# Patient Record
Sex: Female | Born: 1970 | Race: White | Hispanic: No | Marital: Single
Health system: Western US, Academic
[De-identification: ages and names within clinical notes are randomized; demographics above are authoritative.]

## PROBLEM LIST (undated history)

## (undated) DIAGNOSIS — I10 Essential (primary) hypertension: Secondary | ICD-10-CM

## (undated) DIAGNOSIS — M549 Dorsalgia, unspecified: Secondary | ICD-10-CM

## (undated) DIAGNOSIS — G8929 Other chronic pain: Secondary | ICD-10-CM

## (undated) HISTORY — PX: TUBAL LIGATION: SHX77

---

## 2007-04-07 ENCOUNTER — Ambulatory Visit (HOSPITAL_COMMUNITY): Admission: RE | Admit: 2007-04-07 | Discharge: 2007-04-07 | Payer: Self-pay | Admitting: Family Medicine

## 2007-04-13 ENCOUNTER — Other Ambulatory Visit: Admission: RE | Admit: 2007-04-13 | Discharge: 2007-04-13 | Payer: Self-pay | Admitting: Unknown Physician Specialty

## 2007-04-13 ENCOUNTER — Encounter (INDEPENDENT_AMBULATORY_CARE_PROVIDER_SITE_OTHER): Payer: Self-pay | Admitting: Specialist

## 2007-04-13 ENCOUNTER — Encounter (INDEPENDENT_AMBULATORY_CARE_PROVIDER_SITE_OTHER): Payer: Self-pay | Admitting: *Deleted

## 2008-02-09 ENCOUNTER — Ambulatory Visit (HOSPITAL_COMMUNITY): Admission: RE | Admit: 2008-02-09 | Discharge: 2008-02-09 | Payer: Self-pay | Admitting: Family Medicine

## 2014-02-23 MED ADMIN — fentaNYL (PF) (SUBLIMAZE) injection: 100 | INTRAVENOUS | @ 22:00:00 | NDC 00409909332

## 2014-02-23 MED ADMIN — rocuronium (ZEMURON) injection: 30 | INTRAVENOUS | @ 22:00:00 | NDC 00409955805

## 2014-02-23 MED ADMIN — glycopyrrolate (ROBINUL) injection: 0.6 | INTRAVENOUS | @ 23:00:00 | NDC 00143968001

## 2014-02-23 MED ADMIN — lidocaine (PF) (XYLOCAINE) 20 mg/mL (2 %) injection: 60 | INTRAVENOUS | @ 22:00:00 | NDC 63323049507

## 2014-02-23 MED ADMIN — lactated ringers infusion: 250 mL/h | INTRAVENOUS | @ 20:00:00 | NDC 00338011704

## 2014-02-23 MED ADMIN — ketorolac (TORADOL) injection: 30 | INTRAVENOUS | NDC 00409379501

## 2014-02-23 MED ADMIN — dexamethasone (DECADRON) injection: 4 | INTRAVENOUS | @ 22:00:00 | NDC 63323016501

## 2014-02-23 MED ADMIN — bupivacaine (PF) (MARCAINE) 5 mg/mL (0.5 %) injection: 7 | INTRAMUSCULAR | @ 23:00:00 | NDC 00409156029

## 2014-02-23 MED ADMIN — neostigmine (PROSTIGMIN) injection: 3 | INTRAVENOUS | @ 23:00:00 | NDC 00641607701

## 2014-02-23 MED ADMIN — propofol (DIPRIVAN) 10 mg/mL infusion: 100 | INTRAVENOUS | @ 22:00:00 | NDC 63323026965

## 2015-06-06 ENCOUNTER — Emergency Department (HOSPITAL_COMMUNITY): Payer: Self-pay

## 2015-06-06 ENCOUNTER — Emergency Department (HOSPITAL_COMMUNITY)
Admission: EM | Admit: 2015-06-06 | Discharge: 2015-06-06 | Disposition: A | Discharge status: Home / Self Care | Payer: Self-pay | Attending: Emergency Medicine | Admitting: Emergency Medicine

## 2015-06-06 ENCOUNTER — Encounter (HOSPITAL_COMMUNITY): Payer: Self-pay | Admitting: Emergency Medicine

## 2015-06-06 DIAGNOSIS — I10 Essential (primary) hypertension: Secondary | ICD-10-CM | POA: Insufficient documentation

## 2015-06-06 DIAGNOSIS — Y9289 Other specified places as the place of occurrence of the external cause: Secondary | ICD-10-CM | POA: Insufficient documentation

## 2015-06-06 DIAGNOSIS — S43121A Dislocation of right acromioclavicular joint, 100%-200% displacement, initial encounter: Secondary | ICD-10-CM | POA: Insufficient documentation

## 2015-06-06 DIAGNOSIS — Y998 Other external cause status: Secondary | ICD-10-CM | POA: Insufficient documentation

## 2015-06-06 DIAGNOSIS — Y9389 Activity, other specified: Secondary | ICD-10-CM | POA: Insufficient documentation

## 2015-06-06 DIAGNOSIS — W182XXA Fall in (into) shower or empty bathtub, initial encounter: Secondary | ICD-10-CM | POA: Insufficient documentation

## 2015-06-06 DIAGNOSIS — S43101A Unspecified dislocation of right acromioclavicular joint, initial encounter: Secondary | ICD-10-CM

## 2015-06-06 HISTORY — DX: Essential (primary) hypertension: I10

## 2015-06-06 MED ORDER — HYDROCODONE-ACETAMINOPHEN 5-325 MG PO TABS
1.0000 | ORAL_TABLET | Freq: Once | ORAL | Status: AC
  Administered 2015-06-06: 1 via ORAL
  Filled 2015-06-06: qty 1

## 2015-06-06 MED ORDER — IBUPROFEN 600 MG PO TABS: 600.0000 mg | ORAL_TABLET | Freq: Four times a day (QID) | ORAL | Status: DC | PRN

## 2015-06-06 MED ORDER — HYDROCODONE-ACETAMINOPHEN 5-325 MG PO TABS: 1.0000 | ORAL_TABLET | ORAL | Status: DC | PRN

## 2015-06-06 NOTE — ED Notes (Signed)
Pt made aware to return if symptoms worsen or if any life threatening symptoms occur.   

## 2015-06-06 NOTE — Discharge Instructions (Signed)
Acromioclavicular Injuries °The AC (acromioclavicular) joint is the joint in the shoulder where the collarbone (clavicle) meets the shoulder blade (scapula). The part of the shoulder blade connected to the collarbone is called the acromion. Common problems with and treatments for the AC joint are detailed below. °ARTHRITIS °Arthritis occurs when the joint has been injured and the smooth padding between the joints (cartilage) is lost. This is the wear and tear seen in most joints of the body if they have been overused. This causes the joint to produce pain and swelling which is worse with activity.  °AC JOINT SEPARATION °AC joint separation means that the ligaments connecting the acromion of the shoulder blade and collarbone have been damaged, and the two bones no longer line up. AC separations can be anywhere from mild to severe, and are "graded" depending upon which ligaments are torn and how badly they are torn. °· Grade I Injury: the least damage is done, and the AC joint still lines up. °· Grade II Injury: damage to the ligaments which reinforce the AC joint. In a Grade II injury, these ligaments are stretched but not entirely torn. When stressed, the AC joint becomes painful and unstable. °· Grade III Injury: AC and secondary ligaments are completely torn, and the collarbone is no longer attached to the shoulder blade. This results in deformity; a prominence of the end of the clavicle. °AC JOINT FRACTURE °AC joint fracture means that there has been a break in the bones of the AC joint, usually the end of the clavicle. °TREATMENT °TREATMENT OF AC ARTHRITIS °· There is currently no way to replace the cartilage damaged by arthritis. The best way to improve the condition is to decrease the activities which aggravate the problem. Application of ice to the joint helps decrease pain and soreness (inflammation). The use of non-steroidal anti-inflammatory medication is helpful. °· If less conservative measures do not  work, then cortisone shots (injections) may be used. These are anti-inflammatories; they decrease the soreness in the joint and swelling. °· If non-surgical measures fail, surgery may be recommended. The procedure is generally removal of a portion of the end of the clavicle. This is the part of the collarbone closest to your acromion which is stabilized with ligaments to the acromion of the shoulder blade. This surgery may be performed using a tube-like instrument with a light (arthroscope) for looking into a joint. It may also be performed as an open surgery through a small incision by the surgeon. Most patients will have good range of motion within 6 weeks and may return to all activity including sports by 8-12 weeks, barring complications. °TREATMENT OF AN AC SEPARATION °· The initial treatment is to decrease pain. This is best accomplished by immobilizing the arm in a sling and placing an ice pack to the shoulder for 20 to 30 minutes every 2 hours as needed. As the pain starts to subside, it is important to begin moving the fingers, wrist, elbow and eventually the shoulder in order to prevent a stiff or "frozen" shoulder. Instruction on when and how much to move the shoulder will be provided by your caregiver. The length of time needed to regain full motion and function depends on the amount or grade of the injury. Recovery from a Grade I AC separation usually takes 10 to 14 days, whereas a Grade III may take 6 to 8 weeks. °· Grade I and II separations usually do not require surgery. Even Grade III injuries usually allow return to full   activity with few restrictions. Treatment is also based on the activity demands of the injured shoulder. For example, a high level quarterback with an injured throwing arm will receive more aggressive treatment than someone with a desk job who rarely uses his/her arm for strenuous activities. In some cases, a painful lump may persist which could require a later surgery. Surgery  can be very successful, but the benefits must be weighed against the potential risks. TREATMENT OF AN AC JOINT FRACTURE Fracture treatment depends on the type of fracture. Sometimes a splint or sling may be all that is required. Other times surgery may be required for repair. This is more frequently the case when the ligaments supporting the clavicle are completely torn. Your caregiver will help you with these decisions and together you can decide what will be the best treatment. HOME CARE INSTRUCTIONS   Apply ice to the injury for 15-20 minutes each hour while awake for 2 days. Put the ice in a plastic bag and place a towel between the bag of ice and skin.  If a sling has been applied, wear it constantly for as long as directed by your caregiver, even at night. The sling or splint can be removed for bathing or showering or as directed. Be sure to keep the shoulder in the same place as when the sling is on. Do not lift the arm.  If a figure-of-eight splint has been applied it should be tightened gently by another person every day. Tighten it enough to keep the shoulders held back. Allow enough room to place the index finger between the body and strap. Loosen the splint immediately if there is numbness or tingling in the hands.  Take over-the-counter or prescription medicines for pain, discomfort or fever as directed by your caregiver.  If you or your child has received a follow up appointment, it is very important to keep that appointment in order to avoid long term complications, chronic pain or disability. SEEK MEDICAL CARE IF:   The pain is not relieved with medications.  There is increased swelling or discoloration that continues to get worse rather than better.  You or your child has been unable to follow up as instructed.  There is progressive numbness and tingling in the arm, forearm or hand. SEEK IMMEDIATE MEDICAL CARE IF:   The arm is numb, cold or pale.  There is increasing pain  in the hand, forearm or fingers. MAKE SURE YOU:   Understand these instructions.  Will watch your condition.  Will get help right away if you are not doing well or get worse. Document Released: 09/24/2005 Document Revised: 03/08/2012 Document Reviewed: 03/19/2009 [facility] Patient Information 2015 [place]. This information is not intended to replace advice given to you by your health care provider. Make sure you discuss any questions you have with your health care provider.  You may take the hydrocodone prescribed for pain relief.  This will make you drowsy - do not drive within 4 hours of taking this medication.  Also use the ibuprofen as prescribed.  Do not take any additional acetaminophen as there is this product in your hydrocodone.  Use ice as much as is comfortable over the next several days.  Call Dr. Romeo AppleHarrison for an office follow-up for further management of this injury.

## 2015-06-06 NOTE — ED Provider Notes (Signed)
CSN: [id number]     Arrival date & time 06/06/15  1330 History   First MD Initiated Contact with Patient 06/06/15 1435     Chief Complaint  Patient presents with  . Shoulder Pain     (Consider location/radiation/quality/duration/timing/severity/associated sxs/prior Treatment) The history is provided by the patient.   Marie Oliver is a 44 y.o. female presenting with right shoulder pain since slipping in the shower and hitting her right lateral shoulder against the toilet 3 days ago.  She has persistent  Pain which is worsened with movement and has not improved with ice, rest and tylenol used at home. She has noticed a deformity to the shoulder joint as well.  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  She is right handed.  She denies weakness or numbness in her arms and denies any head or neck trauma.     Past Medical History  Diagnosis Date  . Hypertension    Past Surgical History  Procedure Laterality Date  . Tubal ligation     History reviewed. No pertinent family history. History  Substance Use Topics  . Smoking status: Never Smoker   . Smokeless tobacco: Not on file  . Alcohol Use: Yes     Comment: occasional   OB History    No data available     Review of Systems  Constitutional: Negative for fever.  Musculoskeletal: Positive for arthralgias. Negative for myalgias and joint swelling.  Neurological: Negative for weakness and numbness.      Allergies  Codeine  Home Medications   Prior to Admission medications   Medication Sig Start Date End Date Taking? Authorizing Provider  acetaminophen (TYLENOL) 500 MG tablet Take 4,000 mg by mouth daily as needed for moderate pain.   Yes Historical Provider, MD  HYDROcodone-acetaminophen (NORCO/VICODIN) 5-325 MG per tablet Take 1 tablet by mouth every 4 (four) hours as needed. 06/06/15   Burgess AmorJulie Talene Glastetter, PA-C  ibuprofen (ADVIL,MOTRIN) 600 MG  tablet Take 1 tablet (600 mg total) by mouth every 6 (six) hours as needed. 06/06/15   Burgess AmorJulie Mikki Ziff, PA-C   BP 159/95 mmHg  Pulse 68  Temp(Src) 98.5 F (36.9 C) (Oral)  Resp 18  Ht 5\' 10"  (1.778 m)  Wt 230 lb (104.327 kg)  BMI 33.00 kg/m2  SpO2 100%  LMP 04/29/2015 Physical Exam  Constitutional: She appears well-developed and well-nourished.  HENT:  Head: Atraumatic.  Neck: Normal range of motion.  Cardiovascular:  Pulses:      Radial pulses are 2+ on the right side, and 2+ on the left side.  Pulses equal bilaterally  Musculoskeletal: She exhibits tenderness. She exhibits no edema.       Right shoulder: She exhibits decreased range of motion, bony tenderness, pain and decreased strength. She exhibits no swelling, no effusion, no crepitus and normal pulse.  ttp along right clavicle with palpable deformity at distal [facility].    Neurological: She is alert. She has normal strength. She displays normal reflexes. No sensory deficit.  Reflex Scores:      Bicep reflexes are 2+ on the right side and 2+ on the left side. Equal grip strength  Skin: Skin is warm and dry.  Psychiatric: She has a normal mood and affect.    ED Course  Procedures (including critical care time) Labs Review Labs Reviewed - No data to display  Imaging Review Dg Shoulder Right  06/06/2015   CLINICAL DATA:  Shoulder pain. Initial encounter. Trauma few days ago.  EXAM: RIGHT SHOULDER - 2+ VIEW  COMPARISON:  Chest radiograph 02/23/2015.  FINDINGS: There is a new RIGHT AC separation. Superior displacement of the distal clavicle is almost 200%. There is posterior displacement of the clavicle on the axillary view relative to the acromion. This is most compatible with a Type IV AC separation.  The glenohumeral joint is located.  No fracture is present.  IMPRESSION: Type IV RIGHT AC joint separation.   Electronically Signed   By: Andreas Newport M.D.   On: 06/06/2015 15:15     EKG Interpretation None       MDM   Final diagnoses:  Acromioclavicular joint separation, right, initial encounter    Patients labs and/or radiological studies were reviewed and considered during the medical decision making and disposition process.  Results were also discussed with patient. Discussed with Dr. Romeo Apple who will see pt as outpatient - pt to call for appt. Sling provided, ibuprofen, hydrocodone.  Ice tx.  The patient appears reasonably screened and/or stabilized for discharge and I doubt any other medical condition or other [facility] requiring further screening, evaluation, or treatment in the ED at this time prior to discharge.     Burgess Amor, PA-C 06/06/15 1715  Gilda Crease, MD 06/07/15 [phone]

## 2015-06-06 NOTE — ED Notes (Signed)
Pt reports slipped and fell out of the shower on Sunday. Pt report ever since right arm pain. Pt denies hitting head or loc.

## 2015-08-09 ENCOUNTER — Other Ambulatory Visit (HOSPITAL_COMMUNITY): Payer: Self-pay | Admitting: *Deleted

## 2015-08-09 DIAGNOSIS — Z09 Encounter for follow-up examination after completed treatment for conditions other than malignant neoplasm: Secondary | ICD-10-CM

## 2015-08-09 DIAGNOSIS — R921 Mammographic calcification found on diagnostic imaging of breast: Secondary | ICD-10-CM

## 2015-08-20 ENCOUNTER — Encounter (HOSPITAL_COMMUNITY): Payer: Self-pay | Admitting: Anesthesiology

## 2015-08-20 ENCOUNTER — Ambulatory Visit (HOSPITAL_COMMUNITY): Payer: Self-pay | Admitting: Anesthesiology

## 2015-08-20 ENCOUNTER — Encounter (HOSPITAL_COMMUNITY)
Admission: RE | Disposition: A | Discharge status: Home / Self Care | Payer: Self-pay | Source: Ambulatory Visit | Attending: Orthopedic Surgery

## 2015-08-20 ENCOUNTER — Ambulatory Visit (HOSPITAL_COMMUNITY)
Admission: RE | Admit: 2015-08-20 | Discharge: 2015-08-20 | Disposition: A | Discharge status: Home / Self Care | Payer: Self-pay | Source: Ambulatory Visit | Attending: Orthopedic Surgery | Admitting: Orthopedic Surgery

## 2015-08-20 ENCOUNTER — Other Ambulatory Visit: Payer: Self-pay | Admitting: Orthopedic Surgery

## 2015-08-20 DIAGNOSIS — I10 Essential (primary) hypertension: Secondary | ICD-10-CM | POA: Insufficient documentation

## 2015-08-20 DIAGNOSIS — S82841A Displaced bimalleolar fracture of right lower leg, initial encounter for closed fracture: Secondary | ICD-10-CM

## 2015-08-20 DIAGNOSIS — W19XXXA Unspecified fall, initial encounter: Secondary | ICD-10-CM | POA: Insufficient documentation

## 2015-08-20 DIAGNOSIS — Z885 Allergy status to narcotic agent status: Secondary | ICD-10-CM | POA: Insufficient documentation

## 2015-08-20 HISTORY — PX: ORIF ANKLE FRACTURE: SHX5408

## 2015-08-20 LAB — BASIC METABOLIC PANEL
Anion gap: 11 (ref 5–15)
BUN: 6 mg/dL (ref 6–20)
CALCIUM: 9.3 mg/dL (ref 8.9–10.3)
CO2: 24 mmol/L (ref 22–32)
CREATININE: 0.75 mg/dL (ref 0.44–1.00)
Chloride: 105 mmol/L (ref 101–111)
GFR calc non Af Amer: >60 mL/min (ref >60)
Glucose, Bld: 93 mg/dL (ref 65–99)
Potassium: 3.9 mmol/L (ref 3.5–5.1)
SODIUM: 140 mmol/L (ref 135–145)

## 2015-08-20 LAB — CBC
HCT: 34.3 % — ABNORMAL LOW (ref 36.0–46.0)
Hemoglobin: 10.5 g/dL — ABNORMAL LOW (ref 12.0–15.0)
MCH: 24.4 pg — ABNORMAL LOW (ref 26.0–34.0)
MCHC: 30.6 g/dL (ref 30.0–36.0)
MCV: 79.6 fL (ref 78.0–100.0)
PLATELETS: 357 10*3/uL (ref 150–400)
RBC: 4.31 MIL/uL (ref 3.87–5.11)
RDW: 16.2 % — AB (ref 11.5–15.5)
WBC: 10.9 10*3/uL — AB (ref 4.0–10.5)

## 2015-08-20 LAB — HCG, SERUM, QUALITATIVE: Preg, Serum: NEGATIVE

## 2015-08-20 LAB — SURGICAL PCR SCREEN
MRSA, PCR: NEGATIVE
Staphylococcus aureus: NEGATIVE

## 2015-08-20 SURGERY — OPEN REDUCTION INTERNAL FIXATION (ORIF) ANKLE FRACTURE
Anesthesia: General | Site: Ankle | Laterality: Bilateral

## 2015-08-20 MED ORDER — ONDANSETRON HCL 4 MG/2ML IJ SOLN
INTRAMUSCULAR | Status: AC
  Filled 2015-08-20: qty 2

## 2015-08-20 MED ORDER — MIDAZOLAM HCL 2 MG/2ML IJ SOLN
INTRAMUSCULAR | Status: AC
  Filled 2015-08-20: qty 2

## 2015-08-20 MED ORDER — LIDOCAINE HCL (CARDIAC) 20 MG/ML IV SOLN
INTRAVENOUS | Status: AC
  Filled 2015-08-20: qty 5

## 2015-08-20 MED ORDER — MIDAZOLAM HCL 2 MG/2ML IJ SOLN
INTRAMUSCULAR | Status: AC
  Filled 2015-08-20: qty 4

## 2015-08-20 MED ORDER — OXYCODONE-ACETAMINOPHEN 5-325 MG PO TABS: 1.0000 | ORAL_TABLET | Freq: Four times a day (QID) | ORAL | Status: DC | PRN

## 2015-08-20 MED ORDER — MUPIROCIN 2 % EX OINT
1.0000 "application " | TOPICAL_OINTMENT | Freq: Once | CUTANEOUS | Status: AC
  Administered 2015-08-20: 1 via TOPICAL
  Filled 2015-08-20: qty 22

## 2015-08-20 MED ORDER — LACTATED RINGERS IV SOLN
INTRAVENOUS | Status: DC
  Administered 2015-08-20 (×2): via INTRAVENOUS

## 2015-08-20 MED ORDER — MIDAZOLAM HCL 5 MG/5ML IJ SOLN
INTRAMUSCULAR | Status: DC | PRN
  Administered 2015-08-20: 2 mg via INTRAVENOUS

## 2015-08-20 MED ORDER — PROPOFOL 10 MG/ML IV BOLUS
INTRAVENOUS | Status: AC
  Filled 2015-08-20: qty 20

## 2015-08-20 MED ORDER — PHENYLEPHRINE HCL 10 MG/ML IJ SOLN
INTRAMUSCULAR | Status: DC | PRN
  Administered 2015-08-20: 40 ug via INTRAVENOUS

## 2015-08-20 MED ORDER — PHENYLEPHRINE 40 MCG/ML (10ML) SYRINGE FOR IV PUSH (FOR BLOOD PRESSURE SUPPORT)
PREFILLED_SYRINGE | INTRAVENOUS | Status: AC
  Filled 2015-08-20: qty 10

## 2015-08-20 MED ORDER — 0.9 % SODIUM CHLORIDE (POUR BTL) OPTIME
TOPICAL | Status: DC | PRN
  Administered 2015-08-20: 1000 mL

## 2015-08-20 MED ORDER — DEXAMETHASONE SODIUM PHOSPHATE 4 MG/ML IJ SOLN
INTRAMUSCULAR | Status: AC
  Filled 2015-08-20: qty 2

## 2015-08-20 MED ORDER — FENTANYL CITRATE (PF) 100 MCG/2ML IJ SOLN
INTRAMUSCULAR | Status: DC | PRN
  Administered 2015-08-20 (×4): 50 ug via INTRAVENOUS

## 2015-08-20 MED ORDER — ONDANSETRON HCL 4 MG/2ML IJ SOLN
INTRAMUSCULAR | Status: DC | PRN
  Administered 2015-08-20: 4 mg via INTRAVENOUS

## 2015-08-20 MED ORDER — DEXAMETHASONE SODIUM PHOSPHATE 4 MG/ML IJ SOLN
INTRAMUSCULAR | Status: DC | PRN
  Administered 2015-08-20: 8 mg via INTRAVENOUS

## 2015-08-20 MED ORDER — PROPOFOL 10 MG/ML IV BOLUS
INTRAVENOUS | Status: DC | PRN
  Administered 2015-08-20: 200 mg via INTRAVENOUS

## 2015-08-20 MED ORDER — FENTANYL CITRATE (PF) 100 MCG/2ML IJ SOLN
INTRAMUSCULAR | Status: AC
  Filled 2015-08-20: qty 2

## 2015-08-20 MED ORDER — BUPIVACAINE HCL (PF) 0.25 % IJ SOLN
INTRAMUSCULAR | Status: AC
  Filled 2015-08-20: qty 30

## 2015-08-20 MED ORDER — CEFAZOLIN SODIUM-DEXTROSE 2-3 GM-% IV SOLR
2.0000 g | INTRAVENOUS | Status: AC
  Administered 2015-08-20: 2 g via INTRAVENOUS
  Filled 2015-08-20: qty 50

## 2015-08-20 MED ORDER — LIDOCAINE HCL (CARDIAC) 20 MG/ML IV SOLN
INTRAVENOUS | Status: DC | PRN
  Administered 2015-08-20: 50 mg via INTRAVENOUS

## 2015-08-20 MED ORDER — FENTANYL CITRATE (PF) 250 MCG/5ML IJ SOLN
INTRAMUSCULAR | Status: AC
  Filled 2015-08-20: qty 5

## 2015-08-20 MED ORDER — CHLORHEXIDINE GLUCONATE 4 % EX LIQD: 60.0000 mL | Freq: Once | CUTANEOUS | Status: DC

## 2015-08-20 SURGICAL SUPPLY — 61 items
BANDAGE ELASTIC 4 VELCRO ST LF (GAUZE/BANDAGES/DRESSINGS) ×2 IMPLANT
BANDAGE ELASTIC 6 VELCRO ST LF (GAUZE/BANDAGES/DRESSINGS) ×2 IMPLANT
BANDAGE ESMARK 6X9 LF (GAUZE/BANDAGES/DRESSINGS) ×1 IMPLANT
BIT DRILL 2.5X2.75 QC CALB (BIT) ×2 IMPLANT
BIT DRILL 3.5X5.5 QC CALB (BIT) ×2 IMPLANT
BLADE SURG 10 STRL SS (BLADE) ×1 IMPLANT
BLADE SURG ROTATE 9660 (MISCELLANEOUS) IMPLANT
BNDG CMPR 9X6 STRL LF SNTH (GAUZE/BANDAGES/DRESSINGS) ×1
BNDG ESMARK 6X9 LF (GAUZE/BANDAGES/DRESSINGS) ×3
COVER MAYO STAND STRL (DRAPES) ×1 IMPLANT
COVER SURGICAL LIGHT HANDLE (MISCELLANEOUS) ×3 IMPLANT
CUFF TOURNIQUET SINGLE 34IN LL (TOURNIQUET CUFF) ×2 IMPLANT
CUFF TOURNIQUET SINGLE 44IN (TOURNIQUET CUFF) IMPLANT
DRAPE OEC MINIVIEW 54X84 (DRAPES) ×2 IMPLANT
DRAPE U-SHAPE 47X51 STRL (DRAPES) ×3 IMPLANT
DRSG PAD ABDOMINAL 8X10 ST (GAUZE/BANDAGES/DRESSINGS) ×2 IMPLANT
DURAPREP 26ML APPLICATOR (WOUND CARE) ×3 IMPLANT
ELECT REM PT RETURN 9FT ADLT (ELECTROSURGICAL) ×3
ELECTRODE REM PT RTRN 9FT ADLT (ELECTROSURGICAL) ×1 IMPLANT
GAUZE SPONGE 4X4 12PLY STRL (GAUZE/BANDAGES/DRESSINGS) ×2 IMPLANT
GAUZE XEROFORM 1X8 LF (GAUZE/BANDAGES/DRESSINGS) ×4 IMPLANT
GLOVE BIOGEL PI IND STRL 8 (GLOVE) ×2 IMPLANT
GLOVE BIOGEL PI INDICATOR 8 (GLOVE) ×4
GLOVE ECLIPSE 7.5 STRL STRAW (GLOVE) ×6 IMPLANT
GOWN STRL REUS W/ TWL LRG LVL3 (GOWN DISPOSABLE) ×1 IMPLANT
GOWN STRL REUS W/ TWL XL LVL3 (GOWN DISPOSABLE) ×2 IMPLANT
GOWN STRL REUS W/TWL LRG LVL3 (GOWN DISPOSABLE) ×3
GOWN STRL REUS W/TWL XL LVL3 (GOWN DISPOSABLE) ×6
KIT BASIN OR (CUSTOM PROCEDURE TRAY) ×3 IMPLANT
KIT ROOM TURNOVER OR (KITS) ×3 IMPLANT
MANIFOLD NEPTUNE II (INSTRUMENTS) ×1 IMPLANT
NS IRRIG 1000ML POUR BTL (IV SOLUTION) ×3 IMPLANT
PACK ORTHO EXTREMITY (CUSTOM PROCEDURE TRAY) ×3 IMPLANT
PAD ARMBOARD 7.5X6 YLW CONV (MISCELLANEOUS) ×6 IMPLANT
PAD CAST 4YDX4 CTTN HI CHSV (CAST SUPPLIES) IMPLANT
PADDING CAST COTTON 4X4 STRL (CAST SUPPLIES) ×6
PADDING CAST COTTON 6X4 STRL (CAST SUPPLIES) ×2 IMPLANT
PLATE 6H 3.5 143556 (Plate) ×2 IMPLANT
SCREW ACE CAN 4.0 16M (Screw) ×4 IMPLANT
SCREW CORT 3.5X16 815037016 (Screw) ×2 IMPLANT
SCREW CORTICAL 3.5MM  20MM (Screw) ×2 IMPLANT
SCREW CORTICAL 3.5MM 14MM (Screw) ×2 IMPLANT
SCREW CORTICAL 3.5MM 20MM (Screw) IMPLANT
SCREW NLOCK CANC HEX 4X14 (Screw) ×2 IMPLANT
SCREW NLOCK CANC HEX 4X16 (Screw) ×2 IMPLANT
SPLINT FIBERGLASS 4X30 (CAST SUPPLIES) ×2 IMPLANT
SPONGE LAP 4X18 X RAY DECT (DISPOSABLE) ×4 IMPLANT
STAPLER VISISTAT 35W (STAPLE) IMPLANT
SUCTION FRAZIER TIP 10 FR DISP (SUCTIONS) ×3 IMPLANT
SUT ETHILON 3 0 PS 1 (SUTURE) ×2 IMPLANT
SUT ETHILON 4 0 PS 2 18 (SUTURE) IMPLANT
SUT VIC AB 0 CTB1 27 (SUTURE) ×2 IMPLANT
SUT VIC AB 2-0 FS1 27 (SUTURE) ×2 IMPLANT
SUT VIC AB 3-0 FS2 27 (SUTURE) ×2 IMPLANT
SYR CONTROL 10ML LL (SYRINGE) IMPLANT
TOWEL OR 17X24 6PK STRL BLUE (TOWEL DISPOSABLE) ×3 IMPLANT
TOWEL OR 17X26 10 PK STRL BLUE (TOWEL DISPOSABLE) ×3 IMPLANT
TUBE CONNECTING 12'X1/4 (SUCTIONS) ×1
TUBE CONNECTING 12X1/4 (SUCTIONS) ×2 IMPLANT
WATER STERILE IRR 1000ML POUR (IV SOLUTION) ×1 IMPLANT
WIRE K 1.6MM 144256 (MISCELLANEOUS) ×4 IMPLANT

## 2015-08-20 NOTE — Anesthesia Procedure Notes (Addendum)
Procedure Name: LMA Insertion Date/Time: 08/20/2015 3:21 PM Performed by: Marylyn Ishihara Pre-anesthesia Checklist: Patient identified, Emergency Drugs available, Suction available, Patient being monitored and Timeout performed Patient Re-evaluated:Patient Re-evaluated prior to inductionOxygen Delivery Method: Circle system utilized Preoxygenation: Pre-oxygenation with 100% oxygen Intubation Type: IV induction Ventilation: Mask ventilation without difficulty LMA: LMA inserted LMA Size: 4.0 Number of attempts: 1 Placement Confirmation: positive ETCO2 and breath sounds checked- equal and bilateral Tube secured with: Tape Dental Injury: Teeth and Oropharynx as per pre-operative assessment    Anesthesia Regional Block:  Popliteal block  Pre-Anesthetic Checklist: ,, timeout performed, Correct Patient, Correct Site, Correct Laterality, Correct Procedure, Correct Position, site marked, Risks and benefits discussed,  Surgical consent,  Pre-op evaluation,  At surgeon's request and post-op pain management  Laterality: Right  Prep: chloraprep       Needles:  Injection technique: Single-shot  Needle Type: Echogenic Stimulator Needle     Needle Length: 9cm 9 cm Needle Gauge: 22 and 22 G    Additional Needles:  Procedures: ultrasound guided (picture in chart) Popliteal block Narrative:  Start time: 08/20/2015 2:10 PM End time: 08/20/2015 2:15 PM Injection made incrementally with aspirations every 5 mL.  Performed by: Personally   Additional Notes: 30 cc 0.5% Bupivacaine 1:200 Epi injected easily

## 2015-08-20 NOTE — Transfer of Care (Signed)
Immediate Anesthesia Transfer of Care Note  Patient: Marie Oliver  Procedure(s) Performed: Procedure(s): OPEN REDUCTION INTERNAL FIXATION (ORIF) ANKLE FRACTURE MALLEOLAR (Bilateral)  Patient Location: PACU  Anesthesia Type:GA combined with regional for post-op pain  Level of Consciousness: awake, alert , oriented, patient cooperative and responds to stimulation  Airway & Oxygen Therapy: Patient Spontanous Breathing and Patient connected to nasal cannula oxygen  Post-op Assessment: Report given to RN, Post -op Vital signs reviewed and stable, Patient moving all extremities X 4 and Patient able to stick tongue midline  Post vital signs: stable  Last Vitals:  Filed Vitals:   08/20/15 1412  BP: 128/70  Pulse: 86  Temp: 36.9 C  Resp: 18    Complications: No apparent anesthesia complications

## 2015-08-20 NOTE — H&P (Signed)
PREOPERATIVE H&P  Chief Complaint: r ankle pain  HPI: Marie Oliver is a 44 y.o. female who presents for eJAVONNA BALLIf r ankle fracture. Pt was seen at [facility] and no Ortho coverage available and she was sent down for evaluation.  She is seen to have a bi-mal fracture and needs Orif and is taken to OR for ORIF.   I She has failed conservative measures. Pain is rated as severe.  Past Medical History  Diagnosis Date  . Hypertension    Past Surgical History  Procedure Laterality Date  . Tubal ligation     Social History   Social History  . Marital Status: Divorced    Spouse Name: N/A  . Number of Children: N/A  . Years of Education: N/A   Social History Main Topics  . Smoking status: Never Smoker   . Smokeless tobacco: None  . Alcohol Use: Yes     Comment: occasional once on the weekends  . Drug Use: No  . Sexual Activity: Yes    Birth Control/ Protection: Surgical   Other Topics Concern  . None   Social History Narrative   History reviewed. No pertinent family history. Allergies  Allergen Reactions  . Codeine     nausea   Prior to Admission medications   Medication Sig Start Date End Date Taking? Authorizing Provider  ibuprofen (ADVIL,MOTRIN) 600 MG tablet Take 1 tablet (600 mg total) by mouth every 6 (six) hours as needed. 06/06/15  Yes Burgess Amor, PA-C  acetaminophen (TYLENOL) 500 MG tablet Take 4,000 mg by mouth daily as needed for moderate pain.    Historical Provider, MD  HYDROcodone-acetaminophen (NORCO/VICODIN) 5-325 MG per tablet Take 1 tablet by mouth every 4 (four) hours as needed. 06/06/15   Burgess Amor, PA-C     Positive ROS: none  All other systems have been reviewed and were otherwise negative with the exception of those mentioned in the HPI and as above.  Physical Exam: Filed Vitals:   08/20/15 1412  BP: 128/70  Pulse: 86  Temp: 98.4 F (36.9 C)  Resp: 18    General: Alert, no acute distress Cardiovascular: No pedal  edema Respiratory: No cyanosis, no use of accessory musculature GI: No organomegaly, abdomen is soft and non-tender Skin: No lesions in the area of chief complaint Neurologic: Sensation intact distally Psychiatric: Patient is competent for consent with normal mood and affect Lymphatic: No axillary or cervical lymphadenopathy  MUSCULOSKELETAL: r ankle +sts over ankle Pain on all rom XRAY: bimal fracture Assessment/Plan: ankle fx seen at [facility] and sent down as no ortho coverage available. Plan for Procedure(s): OPEN REDUCTION INTERNAL FIXATION (ORIF) ANKLE FRACTURE bi-MALLEOLAR  The risks benefits and alternatives were discussed with the patient including but not limited to the risks of nonoperative treatment, versus surgical intervention including infection, bleeding, nerve injury, malunion, nonunion, hardware prominence, hardware failure, need for hardware removal, blood clots, cardiopulmonary complications, morbidity, mortality, among others, and they were willing to proceed.  Predicted outcome is good, although there will be at least a six to nine month expected recovery.  Harvie Junior, MD 08/20/2015 2:33 PM

## 2015-08-20 NOTE — Discharge Instructions (Signed)
Ambulate nonweightbearing on your right lower extremity with crutches. Elevate your right leg as much as possible. Use pain medication as needed.

## 2015-08-20 NOTE — Brief Op Note (Signed)
08/20/2015  4:15 PM  PATIENT:  Marie Oliver  44 y.o. female  PRE-OPERATIVE DIAGNOSIS:  ankle fx  POST-OPERATIVE DIAGNOSIS:  ankle fx  PROCEDURE:  Procedure(s): OPEN REDUCTION INTERNAL FIXATION (ORIF) ANKLE FRACTURE MALLEOLAR (Bilateral)  SURGEON:  Surgeon(s) and Role:    * Jodi Geralds, MD - Primary  PHYSICIAN ASSISTANT:   ASSISTANTS: bethune   ANESTHESIA:   none  EBL:     BLOOD ADMINISTERED:none  DRAINS: none   LOCAL MEDICATIONS USED:  NONE  SPECIMEN:  No Specimen  DISPOSITION OF SPECIMEN:  N/A  COUNTS:  YES  TOURNIQUET:  * Missing tourniquet times found for documented tourniquets in log:  [id number] *  DICTATION: .Other Dictation: Dictation Number missed it  PLAN OF CARE: Discharge to home after PACU  PATIENT DISPOSITION:  PACU - hemodynamically stable.   Delay start of Pharmacological VTE agent (>24hrs) due to surgical blood loss or risk of bleeding: no

## 2015-08-20 NOTE — Progress Notes (Signed)
Orthopedic Tech Progress Note Patient Details:  Marie Oliver 11-11-1971 [id number]  Ortho Devices Type of Ortho Device: Postop shoe/boot   [name] 08/20/2015, 6:29 PM

## 2015-08-20 NOTE — Anesthesia Preprocedure Evaluation (Signed)
Anesthesia Evaluation  Patient identified by MRN, date of birth, ID band Patient awake    Reviewed: Allergy & Precautions, NPO status , Patient's Chart, lab work & pertinent test results  Airway Mallampati: II  TM Distance: >3 FB Neck ROM: Full    Dental  (+) Teeth Intact, Dental Advisory Given   Pulmonary  breath sounds clear to auscultation        Cardiovascular hypertension, Rhythm:Regular Rate:Normal     Neuro/Psych    GI/Hepatic   Endo/Other    Renal/GU      Musculoskeletal   Abdominal   Peds  Hematology   Anesthesia Other Findings   Reproductive/Obstetrics                             Anesthesia Physical Anesthesia Plan  ASA: II  Anesthesia Plan: General   Post-op Pain Management: MAC Combined w/ Regional for Post-op pain   Induction: Intravenous  Airway Management Planned: LMA  Additional Equipment:   Intra-op Plan:   Post-operative Plan:   Informed Consent: I have reviewed the patients History and Physical, chart, labs and discussed the procedure including the risks, benefits and alternatives for the proposed anesthesia with the patient or authorized representative who has indicated his/her understanding and acceptance.   Dental advisory given  Plan Discussed with: CRNA and Anesthesiologist  Anesthesia Plan Comments:         Anesthesia Quick Evaluation  

## 2015-08-20 NOTE — Anesthesia Postprocedure Evaluation (Signed)
  Anesthesia Post-op Note  Patient: Marie Oliver  Procedure(s) Performed: Procedure(s): OPEN REDUCTION INTERNAL FIXATION (ORIF) ANKLE FRACTURE MALLEOLAR (Bilateral)  Patient Location: PACU  Anesthesia Type:General and GA combined with regional for post-op pain  Level of Consciousness: awake, alert  and oriented  Airway and Oxygen Therapy: Patient Spontanous Breathing and Patient connected to nasal cannula oxygen  Post-op Pain: none  Post-op Assessment: Post-op Vital signs reviewed, Patient's Cardiovascular Status Stable, Respiratory Function Stable, Patent Airway and Pain level controlled              Post-op Vital Signs: stable  Last Vitals:  Filed Vitals:   08/20/15 1712  BP: 138/77  Pulse: 63  Temp:   Resp: 18    Complications: No apparent anesthesia complications

## 2015-08-21 ENCOUNTER — Ambulatory Visit (HOSPITAL_COMMUNITY): Payer: Self-pay

## 2015-08-21 ENCOUNTER — Encounter (HOSPITAL_COMMUNITY): Payer: Self-pay | Admitting: Orthopedic Surgery

## 2015-08-21 NOTE — Op Note (Signed)
NAME:  Marie Oliver, Marie Oliver                ACCOUNT NO.:  [id number]  MEDICAL RECORD NO.:  [id number]  LOCATION:  [facility]                         FACILITY:  [facility]  PHYSICIAN:  Harvie Junior, M.D.   DATE OF BIRTH:  03-14-1971  DATE OF PROCEDURE:  08/20/2015 DATE OF DISCHARGE:  08/20/2015                              OPERATIVE REPORT   PREOPERATIVE DIAGNOSIS:  Bimalleolar ankle fracture with significant disruption  of the mortise.  POSTOPERATIVE DIAGNOSIS:  Bimalleolar ankle fracture with significant disruption  of the mortise.  PROCEDURE:1.  Open reduction and internal fixation of bimalleolar ankle fracture with stabilization of the mortise. 2. Intrepretation of multiple intra-operative fluoroscopic images  SURGEON:  Harvie Junior, M.D.  ASSISTANT:  Marshia Ly, P.A.  ANESTHESIA:  General.  BRIEF HISTORY:  [name] is a 44 year old female with a history significant complaints of falling and suffering a  bimalleolar ankle fracture with mortise disruption.  She was seen initially at [facility], and I was  called for unknown reasons, other than that they had no orthopedic coverage at [facility], and we sent her down for evaluation.  X-ray showed bimalleolar ankle fracture, and she was taken to operating room for open reduction and fixation because of bimalleolar ankle fracture with mortise disruption.  DESCRIPTION OF PROCEDURE:  The patient was taken to the operating room. After adequate anesthesia was obtained with general anesthetic, the patient was placed supine on the operating table.  She also had a preoperative popliteal block.  Following this, the leg was exsanguinated.  Blood pressure tourniquet was inflated to 300 mmHg. Following this, an incision was made over the lateral side of subcutaneous tissue down the level of the fracture line, the fracture line was identified,  displaced, irrigated, curetted, and then held in a manipulatively closed position, and this had an  interfragmentary screw placed followed by a 6-holed plate with 3 holes proximal and 2 holes distal to the fracture, anatomic alignment was achieved laterally.  At this time, attention was turned medially where incision was made in the subcutaneous tissue down the level of medial side.  She had a periosteal infolding, which was removed and then following this, the bone was anatomically reduced and held with 2 K-wires and then partially threaded screws were used to compress the fracture fragments.  Once this was done, the wounds were closed in layers, sterile compressive dressing was applied, as well as the U and a posterior splint.  The patient was taken to the recovery, and she was noted to be in satisfactory condition.  The estimated blood for the procedure was none.  Multiple intraoperative fluoroscopic images were taken to ensure adequate reduction.     Harvie Junior, M.D.     Ranae Plumber  D:  08/20/2015  T:  08/21/2015  Job:  [id number]

## 2015-09-11 ENCOUNTER — Other Ambulatory Visit (HOSPITAL_COMMUNITY): Payer: Self-pay | Admitting: *Deleted

## 2015-09-11 ENCOUNTER — Encounter (HOSPITAL_COMMUNITY): Payer: Self-pay

## 2015-09-11 DIAGNOSIS — R921 Mammographic calcification found on diagnostic imaging of breast: Secondary | ICD-10-CM

## 2015-09-11 DIAGNOSIS — Z09 Encounter for follow-up examination after completed treatment for conditions other than malignant neoplasm: Secondary | ICD-10-CM

## 2015-10-30 ENCOUNTER — Ambulatory Visit (HOSPITAL_COMMUNITY)
Admission: RE | Admit: 2015-10-30 | Discharge: 2015-10-30 | Disposition: A | Discharge status: Home / Self Care | Payer: PRIVATE HEALTH INSURANCE | Source: Ambulatory Visit | Attending: *Deleted | Admitting: *Deleted

## 2015-10-30 ENCOUNTER — Other Ambulatory Visit (HOSPITAL_COMMUNITY): Payer: Self-pay | Admitting: *Deleted

## 2015-10-30 DIAGNOSIS — R921 Mammographic calcification found on diagnostic imaging of breast: Secondary | ICD-10-CM | POA: Diagnosis present

## 2015-10-30 DIAGNOSIS — N6310 Unspecified lump in the right breast, unspecified quadrant: Secondary | ICD-10-CM

## 2015-10-30 DIAGNOSIS — Z09 Encounter for follow-up examination after completed treatment for conditions other than malignant neoplasm: Secondary | ICD-10-CM

## 2016-03-27 ENCOUNTER — Emergency Department (HOSPITAL_COMMUNITY)
Admission: EM | Admit: 2016-03-27 | Discharge: 2016-03-27 | Disposition: A | Discharge status: Home / Self Care | Payer: Self-pay | Attending: Emergency Medicine | Admitting: Emergency Medicine

## 2016-03-27 ENCOUNTER — Encounter (HOSPITAL_COMMUNITY): Payer: Self-pay | Admitting: Emergency Medicine

## 2016-03-27 ENCOUNTER — Emergency Department (HOSPITAL_COMMUNITY): Payer: Self-pay

## 2016-03-27 DIAGNOSIS — Y939 Activity, unspecified: Secondary | ICD-10-CM | POA: Insufficient documentation

## 2016-03-27 DIAGNOSIS — W010XXA Fall on same level from slipping, tripping and stumbling without subsequent striking against object, initial encounter: Secondary | ICD-10-CM | POA: Insufficient documentation

## 2016-03-27 DIAGNOSIS — Z79899 Other long term (current) drug therapy: Secondary | ICD-10-CM | POA: Insufficient documentation

## 2016-03-27 DIAGNOSIS — Y999 Unspecified external cause status: Secondary | ICD-10-CM | POA: Insufficient documentation

## 2016-03-27 DIAGNOSIS — Y929 Unspecified place or not applicable: Secondary | ICD-10-CM | POA: Insufficient documentation

## 2016-03-27 DIAGNOSIS — I10 Essential (primary) hypertension: Secondary | ICD-10-CM | POA: Insufficient documentation

## 2016-03-27 DIAGNOSIS — S8391XA Sprain of unspecified site of right knee, initial encounter: Secondary | ICD-10-CM | POA: Insufficient documentation

## 2016-03-27 DIAGNOSIS — S86811A Strain of other muscle(s) and tendon(s) at lower leg level, right leg, initial encounter: Secondary | ICD-10-CM | POA: Insufficient documentation

## 2016-03-27 DIAGNOSIS — M25461 Effusion, right knee: Secondary | ICD-10-CM | POA: Insufficient documentation

## 2016-03-27 MED ORDER — KETOROLAC TROMETHAMINE 10 MG PO TABS
10.0000 mg | ORAL_TABLET | Freq: Once | ORAL | Status: AC
  Administered 2016-03-27: 10 mg via ORAL
  Filled 2016-03-27: qty 1

## 2016-03-27 MED ORDER — ONDANSETRON HCL 4 MG PO TABS
4.0000 mg | ORAL_TABLET | Freq: Once | ORAL | Status: AC
  Administered 2016-03-27: 4 mg via ORAL
  Filled 2016-03-27: qty 1

## 2016-03-27 MED ORDER — HYDROCODONE-ACETAMINOPHEN 5-325 MG PO TABS
1.0000 | ORAL_TABLET | Freq: Once | ORAL | Status: AC
  Administered 2016-03-27: 1 via ORAL
  Filled 2016-03-27: qty 1

## 2016-03-27 MED ORDER — DICLOFENAC SODIUM 75 MG PO TBEC: 75.0000 mg | DELAYED_RELEASE_TABLET | Freq: Two times a day (BID) | ORAL | Status: DC

## 2016-03-27 MED ORDER — HYDROCODONE-ACETAMINOPHEN 5-325 MG PO TABS: 1.0000 | ORAL_TABLET | ORAL | Status: DC | PRN

## 2016-03-27 NOTE — ED Provider Notes (Signed)
CSN: [id number]     Arrival date & time 03/27/16  4098 History   First MD Initiated Contact with Patient 03/27/16 [phone]     Chief Complaint  Patient presents with  . Knee Pain     (Consider location/radiation/quality/duration/timing/severity/associated sxs/prior Treatment) Patient is a 45 y.o. female presenting with knee pain. The history is provided by the patient.  Knee Pain Location:  Knee Time since incident:  3 hours Injury: yes   Mechanism of injury: fall   Mechanism of injury comment:  Patient slipped on a wet surface, she feels that her leg went in one direction, and her body weight when in the mother and she injured her right knee. Fall:    Fall occurred:  Standing Knee location:  R knee Pain details:    Quality:  Throbbing   Radiates to:  Does not radiate   Severity:  Moderate   Onset quality:  Sudden   Duration:  3 hours   Timing:  Constant   Progression:  Worsening Chronicity:  New Prior injury to area:  No Relieved by:  Nothing Worsened by:  Bearing weight Ineffective treatments:  None tried Associated symptoms: decreased ROM   Associated symptoms: no back pain, no neck pain, no numbness and no tingling     Past Medical History  Diagnosis Date  . Hypertension    Past Surgical History  Procedure Laterality Date  . Tubal ligation    . Orif ankle fracture Bilateral 08/20/2015    Procedure: OPEN REDUCTION INTERNAL FIXATION (ORIF) ANKLE FRACTURE MALLEOLAR;  Surgeon: Jodi Geralds, MD;  Location: [facility] OR;  Service: Orthopedics;  Laterality: Bilateral;   History reviewed. No pertinent family history. Social History  Substance Use Topics  . Smoking status: Never Smoker   . Smokeless tobacco: None  . Alcohol Use: Yes     Comment: occasional once on the weekends   OB History    No data available     Review of Systems  Constitutional: Negative for activity change.       All ROS Neg except as noted in HPI  HENT: Negative for nosebleeds.   Eyes: Negative for  photophobia and discharge.  Respiratory: Negative for cough, shortness of breath and wheezing.   Cardiovascular: Negative for chest pain and palpitations.  Gastrointestinal: Negative for abdominal pain and blood in stool.  Genitourinary: Negative for dysuria, frequency and hematuria.  Musculoskeletal: Positive for arthralgias. Negative for back pain and neck pain.  Skin: Negative.   Neurological: Negative for dizziness, seizures and speech difficulty.  Psychiatric/Behavioral: Negative for hallucinations and confusion.      Allergies  Codeine  Home Medications   Prior to Admission medications   Medication Sig Start Date End Date Taking? Authorizing Provider  acetaminophen (TYLENOL) 500 MG tablet Take 4,000 mg by mouth daily as needed for moderate pain.    Historical Provider, MD  diclofenac (VOLTAREN) 75 MG EC tablet Take 1 tablet (75 mg total) by mouth 2 (two) times daily. 03/27/16   Ivery Quale, PA-C  HYDROcodone-acetaminophen (NORCO/VICODIN) 5-325 MG tablet Take 1 tablet by mouth every 4 (four) hours as needed. 03/27/16   Ivery Quale, PA-C  oxyCODONE-acetaminophen (PERCOCET/ROXICET) 5-325 MG per tablet Take 1-2 tablets by mouth every 6 (six) hours as needed for severe pain. 08/20/15   Marshia Ly, PA-C   BP 146/73 mmHg  Pulse 91  Temp(Src) 98.7 F (37.1 C) (Oral)  Resp 18  Ht  (1.778 m)  Wt 108.863 kg  BMI 34.44 kg/m2  SpO2 100%  LMP 02/25/2016 Physical Exam  Constitutional: She is oriented to person, place, and time. She appears well-developed and well-nourished.  Non-toxic appearance.  HENT:  Head: Normocephalic.  Right Ear: Tympanic membrane and external ear normal.  Left Ear: Tympanic membrane and external ear normal.  Eyes: EOM and lids are normal. Pupils are equal, round, and reactive to light.  Neck: Normal range of motion. Neck supple. Carotid bruit is not present.  Cardiovascular: Normal rate, regular rhythm, normal heart sounds, intact distal pulses and  normal pulses.   Pulmonary/Chest: Breath sounds normal. No respiratory distress.  Abdominal: Soft. Bowel sounds are normal. There is no tenderness. There is no guarding.  Musculoskeletal:       Right knee: She exhibits decreased range of motion and effusion. She exhibits no deformity. Tenderness found. Medial joint line tenderness noted. No patellar tendon tenderness noted.  Could not test for ligament laxity due to pain.  Lymphadenopathy:       Head (right side): No submandibular adenopathy present.       Head (left side): No submandibular adenopathy present.    She has no cervical adenopathy.  Neurological: She is alert and oriented to person, place, and time. She has normal strength. No cranial nerve deficit or sensory deficit.  Skin: Skin is warm and dry.  Psychiatric: She has a normal mood and affect. Her speech is normal.  Nursing note and vitals reviewed.   ED Course  Procedures (including critical care time) Labs Review Labs Reviewed - No data to display  Imaging Review Dg Knee Complete 4 Views Right  03/27/2016  CLINICAL DATA:  Fall this morning, right knee pain EXAM: RIGHT KNEE - COMPLETE 4+ VIEW COMPARISON:  None. FINDINGS: Three views of the right knee submitted. No acute fracture or subluxation. Small joint effusion. Mild prepatellar soft tissue swelling. IMPRESSION: No acute fracture or subluxation. Mild prepatellar soft tissue swelling. Small joint effusion. Electronically Signed   By: Natasha MeadLiviu  Pop M.D.   On: 03/27/2016 08:21   I have personally reviewed and evaluated these images and lab results as part of my medical decision-making.   EKG Interpretation None      MDM  The patient had a slip and fall of this morning. She injured the right knee. X-ray of the right knee is negative for fracture or dislocation. There is joint effusion noted. And some mild prepatellar soft tissue swelling. There no gross neurovascular changes appreciated. Suspect a knee strain/sprain. The  patient is fitted with a knee immobilizer. She has crutches. Patient will apply ice and elevation. Prescription for diclofenac and Norco given to the patient. The patient is referred to Dr. Romeo AppleHarrison for with the pediatric evaluation of this issue.    Final diagnoses:  Knee sprain and strain, right, initial encounter  Knee effusion, right    *I have reviewed nursing notes, vital signs, and all appropriate lab and imaging results for this patient.[address]**    Chanda Laperle, PA-C 03/27/16 [phone]  Donnetta HutchingBrian Cook, MD 03/28/16 [phone]

## 2016-03-27 NOTE — Discharge Instructions (Signed)
The x-ray of your knee is negative for fracture or dislocation. It does show some fluid in the joint (joint effusion). Please use your knee immobilizer until seen by Dr. Romeo AppleHarrison, or the orthopedic specialist of your choice. You do not have to sleep in this device. Please apply ice today and tomorrow. Please use diclofenac 2 times daily with food. May use Norco for pain if needed. This medication may cause drowsiness, please use it with caution. Use  Knee Effusion Knee effusion means that you have extra fluid in your knee. This can cause pain. Your knee may be more difficult to bend and move. HOME CARE  Use crutches as told by your doctor.  Wear a knee brace as told by your doctor.  Apply ice to the swollen area:  Put ice in a plastic bag.  Place a towel between your skin and the bag.  Leave the ice on for 20 minutes, 2-3 times per day.  Keep your knee raised (elevated) when you are sitting or lying down.  Take medicines only as told by your doctor.  Do any rehabilitation or strengthening exercises as told by your doctor.  Rest your knee as told by your doctor. You may start doing your normal activities again when your doctor says it is okay.  Keep all follow-up visits as told by your doctor. This is important. GET HELP IF:   You continue to have pain in your knee. GET HELP RIGHT AWAY IF:  You have increased swelling or redness of your knee.  You have severe pain in your knee.  You have a fever.   This information is not intended to replace advice given to you by your health care provider. Make sure you discuss any questions you have with your health care provider.   Document Released: 01/17/2011 Document Revised: 01/05/2015 Document Reviewed: 07/31/2014 Elsevier Interactive Patient Education 2016 [employer] crutches until you are able to safely apply weight to the right lower extremity.

## 2016-03-27 NOTE — ED Notes (Signed)
Pt reports right knee pain. Pt reports right knee pain after slipping near dog pen this am. Pt denies hitting head or loc. Pt using crutches at time of arrival. Pt reports increased pain with ambulation.

## 2016-05-08 ENCOUNTER — Other Ambulatory Visit (HOSPITAL_COMMUNITY): Payer: Self-pay | Admitting: *Deleted

## 2016-05-08 DIAGNOSIS — N641 Fat necrosis of breast: Secondary | ICD-10-CM

## 2016-05-08 DIAGNOSIS — Z09 Encounter for follow-up examination after completed treatment for conditions other than malignant neoplasm: Secondary | ICD-10-CM

## 2016-05-13 ENCOUNTER — Ambulatory Visit (HOSPITAL_COMMUNITY)
Admission: RE | Admit: 2016-05-13 | Discharge: 2016-05-13 | Disposition: A | Discharge status: Home / Self Care | Payer: PRIVATE HEALTH INSURANCE | Source: Ambulatory Visit | Attending: *Deleted | Admitting: *Deleted

## 2016-05-13 DIAGNOSIS — Z09 Encounter for follow-up examination after completed treatment for conditions other than malignant neoplasm: Secondary | ICD-10-CM

## 2016-05-13 DIAGNOSIS — N641 Fat necrosis of breast: Secondary | ICD-10-CM | POA: Insufficient documentation

## 2016-05-13 DIAGNOSIS — R928 Other abnormal and inconclusive findings on diagnostic imaging of breast: Secondary | ICD-10-CM | POA: Diagnosis present

## 2016-05-16 IMAGING — DX DG KNEE COMPLETE 4+V*R*
4 series · 4 of 4 positions shown · non-contrast
Comparison: None.

CLINICAL DATA: Fall this morning, right knee pain

EXAM:
RIGHT KNEE - COMPLETE 4+ VIEW

[knee ap]
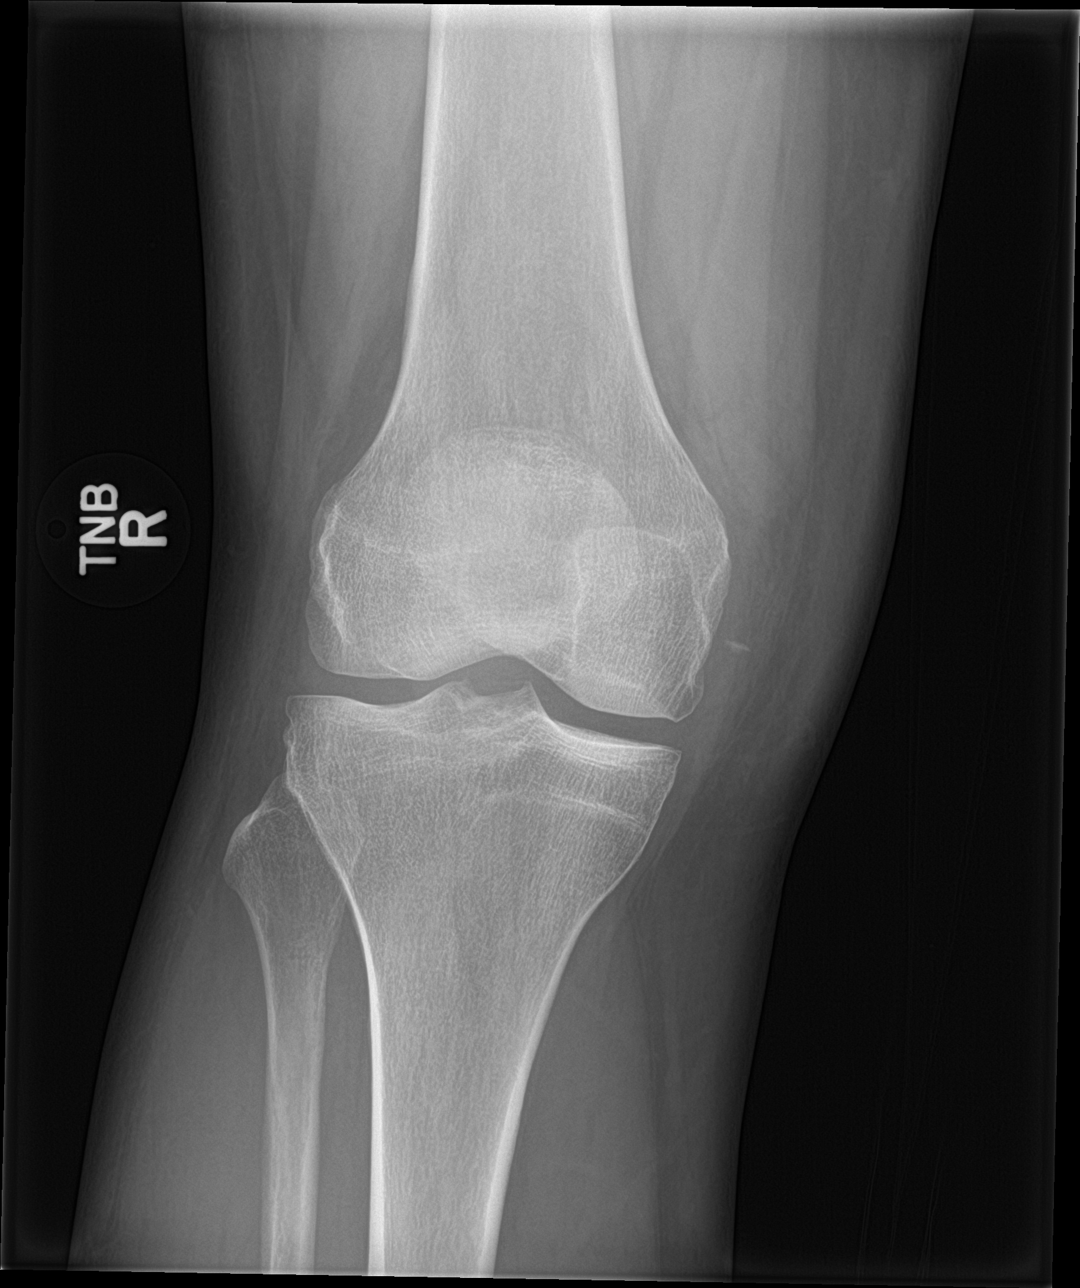

[tunnel]
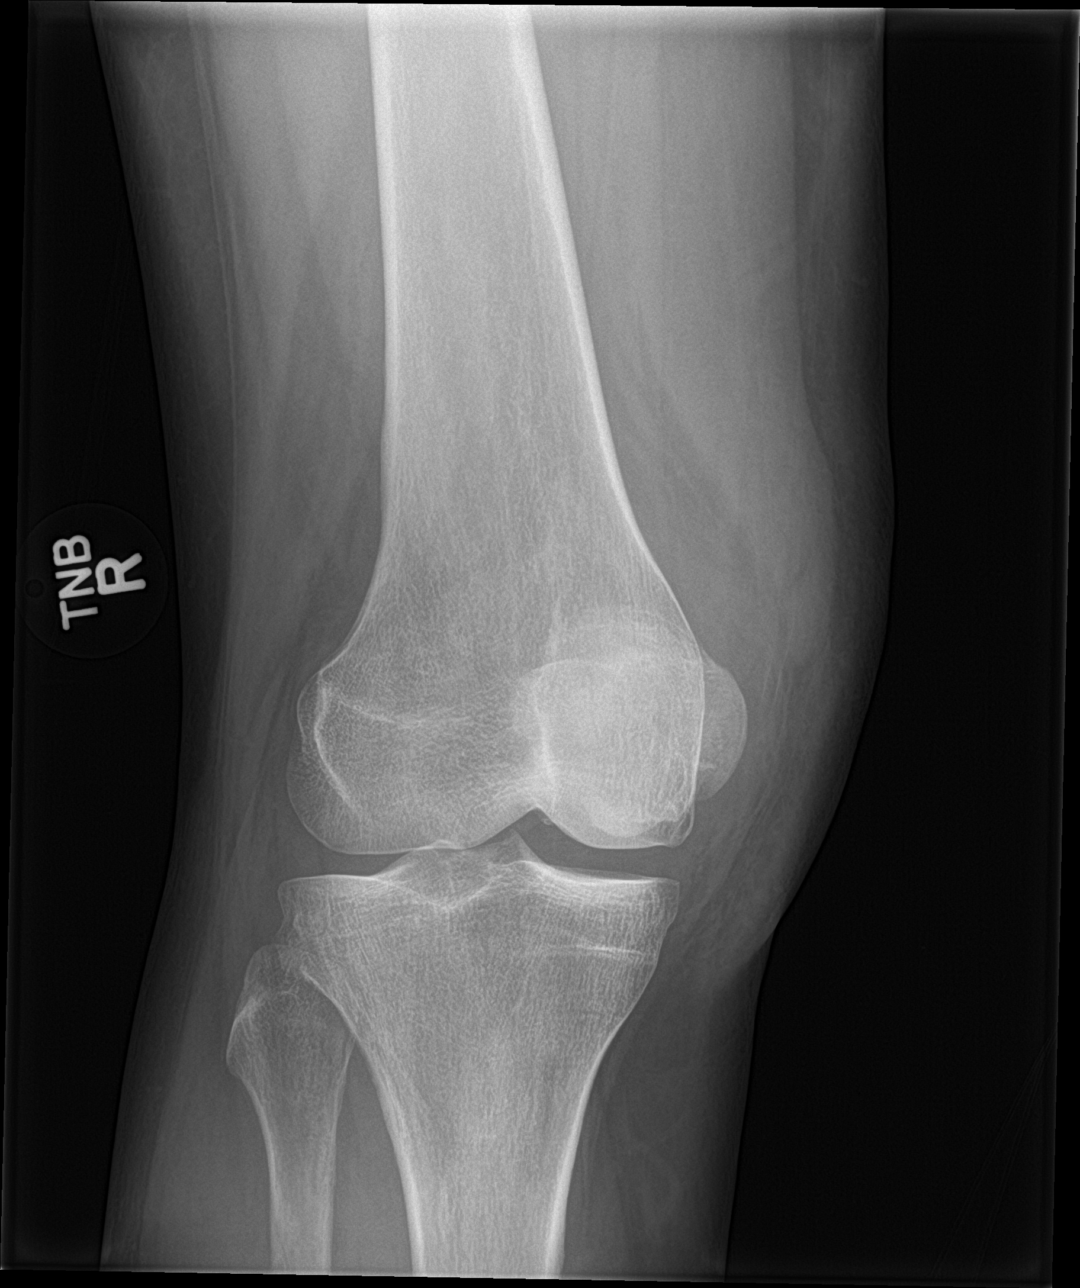

[knee lat]
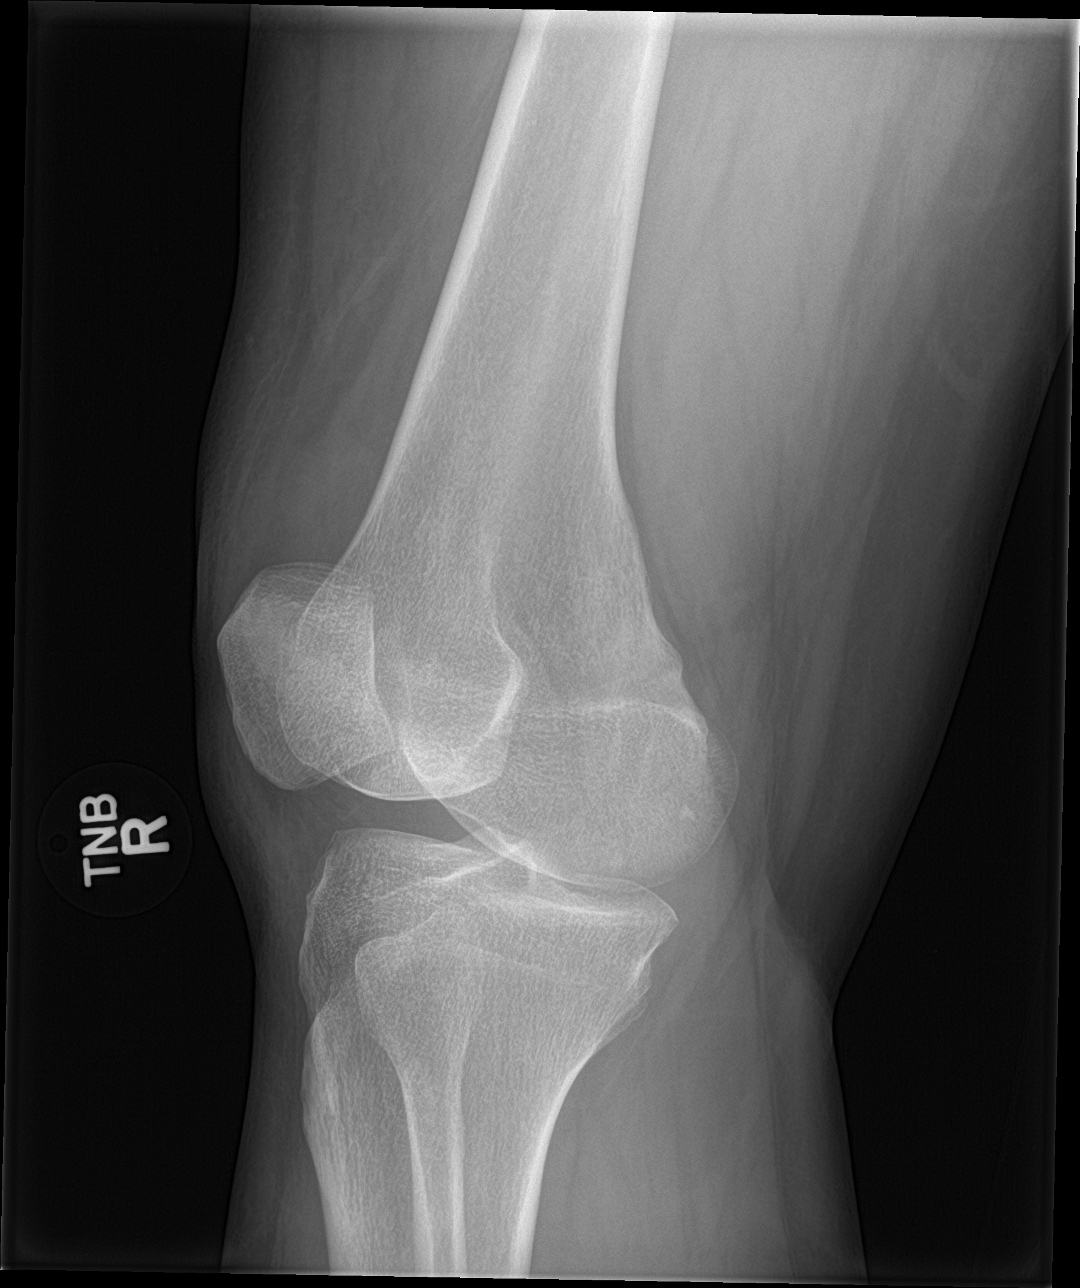

[knee sunrise]
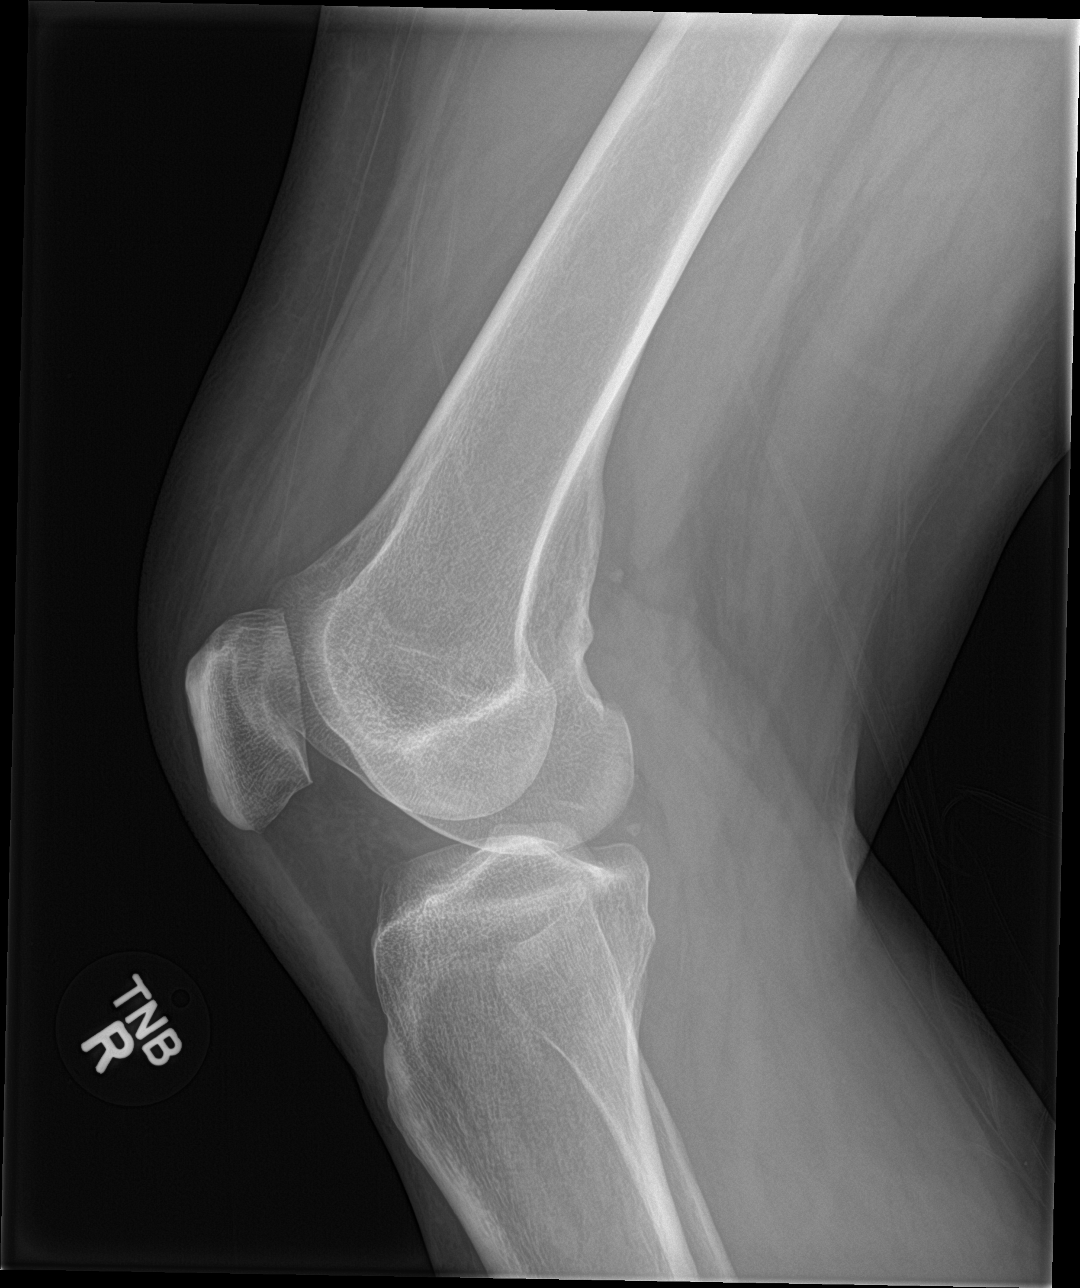

[4 of 4 positions shown; findings below may reference images not displayed]

FINDINGS: Three views of the right knee submitted. No acute fracture or
subluxation. Small joint effusion. Mild prepatellar soft tissue
swelling.
IMPRESSION: No acute fracture or subluxation. Mild prepatellar soft tissue
swelling. Small joint effusion.

## 2016-10-12 ENCOUNTER — Encounter (HOSPITAL_COMMUNITY): Payer: Self-pay | Admitting: *Deleted

## 2016-10-12 ENCOUNTER — Emergency Department (HOSPITAL_COMMUNITY)
Admission: EM | Admit: 2016-10-12 | Discharge: 2016-10-12 | Disposition: A | Discharge status: Home / Self Care | Payer: PRIVATE HEALTH INSURANCE | Attending: Emergency Medicine | Admitting: Emergency Medicine

## 2016-10-12 DIAGNOSIS — Z79899 Other long term (current) drug therapy: Secondary | ICD-10-CM | POA: Insufficient documentation

## 2016-10-12 DIAGNOSIS — I10 Essential (primary) hypertension: Secondary | ICD-10-CM | POA: Insufficient documentation

## 2016-10-12 DIAGNOSIS — M542 Cervicalgia: Secondary | ICD-10-CM

## 2016-10-12 DIAGNOSIS — M436 Torticollis: Secondary | ICD-10-CM

## 2016-10-12 MED ORDER — PREDNISONE 10 MG PO TABS: ORAL_TABLET | ORAL | 0 refills | Status: DC

## 2016-10-12 MED ORDER — OXYCODONE-ACETAMINOPHEN 5-325 MG PO TABS: 2.0000 | ORAL_TABLET | ORAL | 0 refills | Status: DC | PRN

## 2016-10-12 MED ORDER — METHOCARBAMOL 500 MG PO TABS: 500.0000 mg | ORAL_TABLET | Freq: Two times a day (BID) | ORAL | 0 refills | Status: DC

## 2016-10-12 NOTE — ED Provider Notes (Signed)
[facility] DEPT Provider Note   CSN: [id number] Arrival date & time: 10/12/16  1158  By signing my name below, I, Vista Mink, attest that this documentation has been prepared under the direction and in the presence of Langston Masker PA-C.  Electronically Signed: Vista Mink, ED Scribe. 10/12/16. 12:38 PM.  History   Chief Complaint Chief Complaint  Patient presents with  . Neck Pain    HPI HPI Comments: Marie Oliver is a 45 y.o. female, with a Hx of bulging disc, who presents to the Emergency Department complaining of constant right sided neck pain that started yesterday morning. Pt just finished working four 12 hour shifts and slept for an extended period of time two nights ago. Pt states that she went to bed and then woke up yesterday morning with sharp pain to the right side of her neck. Pt reports exacerbating pain when turning her head to the right and left. She has a Hx of bulging disc and was dx with it 4 years ago after an MRI. Pt has frequent numbness and tingling in her hands which is normal for her but no acute differences.   The history is provided by the patient. No language interpreter was used.    Past Medical History:  Diagnosis Date  . Hypertension     Patient Active Problem List   Diagnosis Date Noted  . Displaced bimalleolar fracture of right ankle 08/20/2015    Past Surgical History:  Procedure Laterality Date  . ORIF ANKLE FRACTURE Bilateral 08/20/2015   Procedure: OPEN REDUCTION INTERNAL FIXATION (ORIF) ANKLE FRACTURE MALLEOLAR;  Surgeon: Jodi Geralds, MD;  Location: [facility] OR;  Service: Orthopedics;  Laterality: Bilateral;  . TUBAL LIGATION      OB History    No data available      Home Medications    Prior to Admission medications   Medication Sig Start Date End Date Taking? Authorizing Provider  acetaminophen (TYLENOL) 500 MG tablet Take 4,000 mg by mouth daily as needed for moderate pain.    Historical Provider, MD  diclofenac (VOLTAREN) 75  MG EC tablet Take 1 tablet (75 mg total) by mouth 2 (two) times daily. 03/27/16   Ivery Quale, PA-C  HYDROcodone-acetaminophen (NORCO/VICODIN) 5-325 MG tablet Take 1 tablet by mouth every 4 (four) hours as needed. 03/27/16   Ivery Quale, PA-C  oxyCODONE-acetaminophen (PERCOCET/ROXICET) 5-325 MG per tablet Take 1-2 tablets by mouth every 6 (six) hours as needed for severe pain. 08/20/15   Marshia Ly, PA-C   Family History No family history on file.  Social History Social History  Substance Use Topics  . Smoking status: Never Smoker  . Smokeless tobacco: Never Used  . Alcohol use Yes     Comment: occasional once on the weekends     Allergies   Codeine   Review of Systems Review of Systems  Musculoskeletal: Positive for neck pain (right sided ).  Neurological: Negative for numbness.  All other systems reviewed and are negative.    Physical Exam Updated Vital Signs BP 179/89 (BP Location: Left Arm)   Pulse 73   Temp 98.1 F (36.7 C) (Oral)   Resp 17   Ht 5\' 9"  (1.753 m)   Wt 234 lb (106.1 kg)   LMP 09/30/2016   SpO2 100%   BMI 34.56 kg/m   Physical Exam  Constitutional: She is oriented to person, place, and time. She appears well-developed and well-nourished. No distress.  HENT:  Head: Normocephalic and atraumatic.  Neck:  Decreased  ROM   Pulmonary/Chest: Effort normal.  Neurological: She is alert and oriented to person, place, and time.  Skin: Skin is warm and dry. She is not diaphoretic.  Psychiatric: She has a normal mood and affect. Judgment normal.  Nursing note and vitals reviewed.   ED Treatments / Results  DIAGNOSTIC STUDIES: Oxygen Saturation is 100% on RA, normal by my interpretation.  COORDINATION OF CARE: 12:35 PM-Will order pain medication and steroids. Discussed treatment plan with pt at bedside and pt agreed to plan.   Labs (all labs ordered are listed, but only abnormal results are displayed) Labs Reviewed - No data to display  EKG   EKG Interpretation None       Radiology No results found.  Procedures Procedures (including critical care time)  Medications Ordered in ED Medications - No data to display   Initial Impression / Assessment and Plan / ED Course  I have reviewed the triage vital signs and the nursing notes.  Pertinent labs & imaging results that were available during my care of the patient were reviewed by me and considered in my medical decision making (see chart for details).  Clinical Course      Final Clinical Impressions(s) / ED Diagnoses   Final diagnoses:  Torticollis  Neck pain  Torticollis, acute    New Prescriptions Discharge Medication List as of 10/12/2016 12:41 PM    START taking these medications   Details  methocarbamol (ROBAXIN) 500 MG tablet Take 1 tablet (500 mg total) by mouth 2 (two) times daily., Starting Sun 10/12/2016, Print    predniSONE (DELTASONE) 10 MG tablet 6,5,4,3,2,1, Print      An After Visit Summary was printed and given to the patient.   Lonia SkinnerLeslie K WestervilleSofia, PA-C 10/12/16 1404    Bethann BerkshireJoseph Zammit, MD 10/15/16 1228

## 2016-10-12 NOTE — ED Triage Notes (Signed)
Pt comes in with right sided neck pain starting yesterday morning when she got up. Pt denies any injury. She had history of bulging disc. States this feels similar to previous flares.

## 2016-11-24 ENCOUNTER — Encounter (HOSPITAL_COMMUNITY): Payer: Self-pay | Admitting: Emergency Medicine

## 2016-11-24 ENCOUNTER — Emergency Department (HOSPITAL_COMMUNITY): Payer: Self-pay

## 2016-11-24 ENCOUNTER — Emergency Department (HOSPITAL_COMMUNITY)
Admission: EM | Admit: 2016-11-24 | Discharge: 2016-11-24 | Disposition: A | Discharge status: Home / Self Care | Payer: Self-pay | Attending: Emergency Medicine | Admitting: Emergency Medicine

## 2016-11-24 DIAGNOSIS — Z79899 Other long term (current) drug therapy: Secondary | ICD-10-CM | POA: Insufficient documentation

## 2016-11-24 DIAGNOSIS — Y939 Activity, unspecified: Secondary | ICD-10-CM | POA: Insufficient documentation

## 2016-11-24 DIAGNOSIS — M549 Dorsalgia, unspecified: Secondary | ICD-10-CM | POA: Insufficient documentation

## 2016-11-24 DIAGNOSIS — Y929 Unspecified place or not applicable: Secondary | ICD-10-CM | POA: Insufficient documentation

## 2016-11-24 DIAGNOSIS — S20211A Contusion of right front wall of thorax, initial encounter: Secondary | ICD-10-CM | POA: Insufficient documentation

## 2016-11-24 DIAGNOSIS — W010XXA Fall on same level from slipping, tripping and stumbling without subsequent striking against object, initial encounter: Secondary | ICD-10-CM | POA: Insufficient documentation

## 2016-11-24 DIAGNOSIS — I10 Essential (primary) hypertension: Secondary | ICD-10-CM | POA: Insufficient documentation

## 2016-11-24 DIAGNOSIS — Y990 Civilian activity done for income or pay: Secondary | ICD-10-CM | POA: Insufficient documentation

## 2016-11-24 MED ORDER — HYDROCODONE-ACETAMINOPHEN 7.5-325 MG PO TABS: 1.0000 | ORAL_TABLET | Freq: Four times a day (QID) | ORAL | 0 refills | Status: DC | PRN

## 2016-11-24 NOTE — ED Notes (Signed)
As I brought patient to room, spouse states "can she get some ginger ale or something to drink because those medicines she took mess her stomach up." When asked what medications patient took, she stated "[facility] Patient stated earlier in triage that she received [facility] and [facility]. Advised Brookammy Triplett GeorgiaPA.

## 2016-11-24 NOTE — Discharge Instructions (Signed)
Alternate ice and heat to the injured area.  Continue taking the Toradol and valium as directed.  Follow-up with your primary doctor for recheck

## 2016-11-24 NOTE — ED Provider Notes (Signed)
[facility] Provider Note   CSN: [id number] Arrival date & time: 11/24/16  1022  By signing my name below, I, Emmanuella Mensah, attest that this documentation has been prepared under the direction and in the presence of Laquinda Moller, PA-C. Electronically Signed: Angelene GiovanniEmmanuella Mensah, ED Scribe. 11/24/16. 12:19 PM.   History   Chief Complaint Chief Complaint  Patient presents with  . Back Pain    HPI Comments: Marie Oliver is a 45 y.o. female with a hx of hypertension and chronic back pain that radiates to her right shoulder/arm who presents to the Emergency Department complaining of gradually worsening moderate right upper back pain under her posterior shoulder blade she describes as spasms s/p fall that occurred while at work 3 days ago. She notes that the pain is worse with deep breathing and movement but better with applied pressure to the area. She reports associated exacerbation of her chronic right shoulder pain with intermittent numbness/tingling in her right fingers. She explains that she was ambulating when she tripped over a pallet and fell on her right side onto concrete. She denies any head injuries or LOC. She has been able to ambulate since the fall. [facility] ago where she received Toradol IM there and discharged with 5 mg Valium and 10 mg Ketorolac. She denies any imaging during that evaluation. She states that she is here today because her symptoms are not improving despite treatment. She denies any worker's comp involvement.  Pt denies any fever, chills, nausea, vomiting, chest pain, neck pain, shortness of breath, open wounds, or any other symptoms.   The history is provided by the patient. No language interpreter was used.  Back Pain   This is a new problem. The current episode started more than 2 days ago. The problem has been gradually worsening. The pain is associated with falling. The pain is present in the thoracic spine.  Quality: spasm. Radiates to: right shoulder. The pain is moderate. The symptoms are aggravated by certain positions. The pain is the same all the time. Pertinent negatives include no chest pain, no fever, no numbness, no dysuria and no weakness. She has tried muscle relaxants for the symptoms. The treatment provided mild relief.    Past Medical History:  Diagnosis Date  . Hypertension     Patient Active Problem List   Diagnosis Date Noted  . Displaced bimalleolar fracture of right ankle 08/20/2015    Past Surgical History:  Procedure Laterality Date  . ORIF ANKLE FRACTURE Bilateral 08/20/2015   Procedure: OPEN REDUCTION INTERNAL FIXATION (ORIF) ANKLE FRACTURE MALLEOLAR;  Surgeon: Jodi GeraldsJohn Graves, MD;  Location: [facility] OR;  Service: Orthopedics;  Laterality: Bilateral;  . TUBAL LIGATION      OB History    No data available       Home Medications    Prior to Admission medications   Medication Sig Start Date End Date Taking? Authorizing Provider  diclofenac (VOLTAREN) 75 MG EC tablet Take 1 tablet (75 mg total) by mouth 2 (two) times daily. 03/27/16   Ivery QualeHobson Bryant, PA-C  methocarbamol (ROBAXIN) 500 MG tablet Take 1 tablet (500 mg total) by mouth 2 (two) times daily. 10/12/16   Elson AreasLeslie K Sofia, PA-C  oxyCODONE-acetaminophen (PERCOCET/ROXICET) 5-325 MG tablet Take 2 tablets by mouth every 4 (four) hours as needed for severe pain. 10/12/16   Elson AreasLeslie K Sofia, PA-C  predniSONE (DELTASONE) 10 MG tablet 6,5,4,3,2,1 10/12/16   Elson AreasLeslie K Sofia, PA-C    Family History  History reviewed. No pertinent family history.  Social History Social History  Substance Use Topics  . Smoking status: Never Smoker  . Smokeless tobacco: Never Used  . Alcohol use Yes     Comment: occasional once on the weekends     Allergies   Codeine   Review of Systems Review of Systems  Constitutional: Negative for chills and fever.  Respiratory: Negative.  Negative for shortness of breath.   Cardiovascular:  Negative.  Negative for chest pain.  Gastrointestinal: Negative for nausea and vomiting.  Genitourinary: Negative for dysuria, flank pain and hematuria.  Musculoskeletal: Positive for back pain. Negative for neck pain.  Skin: Negative for wound.  Neurological: Negative for weakness and numbness.     Physical Exam Updated Vital Signs BP 160/82 (BP Location: Right Arm)   Pulse 81   Temp 98.2 F (36.8 C) (Oral)   Resp 20   Ht 5\' 10"  (1.778 m)   Wt 240 lb (108.9 kg)   LMP 11/17/2016   SpO2 100%   BMI 34.44 kg/m   Physical Exam  Constitutional: She is oriented to person, place, and time. She appears well-developed and well-nourished.  HENT:  Head: Normocephalic and atraumatic.  Mouth/Throat: Oropharynx is clear and moist.  Neck: Normal range of motion.  Cardiovascular: Normal rate, regular rhythm and intact distal pulses.   Pulmonary/Chest: Effort normal and breath sounds normal. She exhibits no tenderness.  Abdominal: Soft. She exhibits no distension. There is no tenderness. There is no guarding.  Musculoskeletal: She exhibits tenderness. She exhibits no deformity.  Tenderness along the lower right scapular border and right thoracic paraspinal muscle and lateral ribs; no spinal tenderness, no edema, bruising or crepitus  Neurological: She is alert and oriented to person, place, and time.  Strong and symmetrical grip strength bilaterally; no motor weakness; sensation intact  Skin: Skin is warm and dry.  Psychiatric: She has a normal mood and affect.  Nursing note and vitals reviewed.    ED Treatments / Results  DIAGNOSTIC STUDIES: Oxygen Saturation is 100% on RA, normal by my interpretation.    COORDINATION OF CARE: 12:15 PM- Pt advised of plan for treatment and pt agrees. Pt will receive right rib x-ray for further evaluation.    Labs (all labs ordered are listed, but only abnormal results are displayed) Labs Reviewed - No data to display  EKG  EKG  Interpretation None       Radiology Dg Ribs Unilateral W/chest Right  Result Date: 11/24/2016 CLINICAL DATA:  Pain following fall 3 days prior EXAM: RIGHT RIBS AND CHEST - 3+ VIEW COMPARISON:  February 23, 2015 FINDINGS: Lungs are clear. Heart size and pulmonary vascularity are normal. No adenopathy. There is right-sided acromioclavicular and coracoclavicular separation. There is no evident pneumothorax or pleural effusion. There is no demonstrable acute fracture. IMPRESSION: No demonstrable acute fracture. Lungs clear. No pneumothorax. There is right-sided acromioclavicular and coracoclavicular separation. Electronically Signed   By: Bretta BangWilliam  Woodruff III M.D.   On: 11/24/2016 12:49      Procedures Procedures (including critical care time)  Medications Ordered in ED Medications - No data to display   Initial Impression / Assessment and Plan / ED Course  Yong Wahlquist, PA-C has reviewed the triage vital signs and the nursing notes.  Pertinent labs & imaging results that were available during my care of the patient were reviewed by me and considered in my medical decision making (see chart for details).  Clinical Course     Pain to  the right shoulder and scapular region.  Pain reproduced with ROM.  NV intact.  Likely musculoskeletal.  No focal neuro deficits, doubt dissection.  Pt ambulates with steady gait.  Agrees to symptomatic tx and PMD f/u if needed.   The patient appears reasonably screened and/or stabilized for discharge and I doubt any other medical condition or other [facility] requiring further screening, evaluation, or treatment in the ED at this time prior to discharge.   Final Clinical Impressions(s) / ED Diagnoses   Final diagnoses:  Contusion of rib on right side, initial encounter    New Prescriptions New Prescriptions   No medications on file   I personally performed the services described in this documentation, which was scribed in my presence. The recorded  information has been reviewed and is accurate.    Pauline Aus, PA-C 11/27/16 1525    Blane Ohara, MD 11/29/16 [phone]

## 2016-11-24 NOTE — ED Triage Notes (Signed)
Patient complaining of middle right back pain after falling at work on Friday. [facility]. States she was given valium and toradol "but it's not helping at all."

## 2016-12-16 ENCOUNTER — Encounter (HOSPITAL_COMMUNITY): Payer: Self-pay | Admitting: Emergency Medicine

## 2016-12-16 ENCOUNTER — Emergency Department (HOSPITAL_COMMUNITY)
Admission: EM | Admit: 2016-12-16 | Discharge: 2016-12-16 | Disposition: A | Discharge status: Home / Self Care | Payer: PRIVATE HEALTH INSURANCE | Attending: Emergency Medicine | Admitting: Emergency Medicine

## 2016-12-16 DIAGNOSIS — Y929 Unspecified place or not applicable: Secondary | ICD-10-CM | POA: Insufficient documentation

## 2016-12-16 DIAGNOSIS — W19XXXA Unspecified fall, initial encounter: Secondary | ICD-10-CM | POA: Insufficient documentation

## 2016-12-16 DIAGNOSIS — S29019A Strain of muscle and tendon of unspecified wall of thorax, initial encounter: Secondary | ICD-10-CM

## 2016-12-16 DIAGNOSIS — S29012A Strain of muscle and tendon of back wall of thorax, initial encounter: Secondary | ICD-10-CM | POA: Insufficient documentation

## 2016-12-16 DIAGNOSIS — I10 Essential (primary) hypertension: Secondary | ICD-10-CM | POA: Insufficient documentation

## 2016-12-16 DIAGNOSIS — Y999 Unspecified external cause status: Secondary | ICD-10-CM | POA: Insufficient documentation

## 2016-12-16 DIAGNOSIS — Y939 Activity, unspecified: Secondary | ICD-10-CM | POA: Insufficient documentation

## 2016-12-16 MED ORDER — TRAMADOL HCL 50 MG PO TABS: 50.0000 mg | ORAL_TABLET | Freq: Four times a day (QID) | ORAL | 0 refills | Status: DC | PRN

## 2016-12-16 MED ORDER — CYCLOBENZAPRINE HCL 10 MG PO TABS: 10.0000 mg | ORAL_TABLET | Freq: Every day | ORAL | 0 refills | Status: DC

## 2016-12-16 MED ORDER — PREDNISONE 50 MG PO TABS: 50.0000 mg | ORAL_TABLET | Freq: Every day | ORAL | 0 refills | Status: DC

## 2016-12-16 NOTE — ED Provider Notes (Signed)
[facility] DEPT Provider Note   CSN: [id number] Arrival date & time: 12/16/16  [id number]     History   Chief Complaint Chief Complaint  Patient presents with  . Back Pain    HPI Marie Oliver is a 45 y.o. female.  HPI Patient presents to the emergency department with mid thoracic back pain just below the scapula.  The patient states that she was seen recently following a fall and she states that the pain has been persistent since that time.  She states she has had some problems with this area in the past.  She states that she had an MRI that showed some issues with her neck previously but nothing in her thoracic spine other than degenerative changes.  The patient states that she has been taking naproxen and Tylenol without relief of her symptoms. The patient denies chest pain, shortness of breath, headache,blurred vision, neck pain, fever, cough, weakness, numbness, dizziness, anorexia, edema, abdominal pain, nausea, vomiting, diarrhea, rash, back pain, dysuria, hematemesis, bloody stool, near syncope, or syncope. Past Medical History:  Diagnosis Date  . Hypertension     Patient Active Problem List   Diagnosis Date Noted  . Displaced bimalleolar fracture of right ankle 08/20/2015    Past Surgical History:  Procedure Laterality Date  . ORIF ANKLE FRACTURE Bilateral 08/20/2015   Procedure: OPEN REDUCTION INTERNAL FIXATION (ORIF) ANKLE FRACTURE MALLEOLAR;  Surgeon: Jodi GeraldsJohn Graves, MD;  Location: [facility] OR;  Service: Orthopedics;  Laterality: Bilateral;  . TUBAL LIGATION      OB History    Gravida Para Term Preterm AB Living             2   SAB TAB Ectopic Multiple Live Births                   Home Medications    Prior to Admission medications   Medication Sig Start Date End Date Taking? Authorizing Provider  diazepam (VALIUM) 5 MG tablet Take 5 mg by mouth every 6 (six) hours as needed for anxiety.    Historical Provider, MD  HYDROcodone-acetaminophen (NORCO) 7.5-325 MG  tablet Take 1 tablet by mouth every 6 (six) hours as needed for moderate pain. 11/24/16   Tammy Triplett, PA-C  ketorolac (TORADOL) 10 MG tablet Take 10 mg by mouth every 6 (six) hours as needed.    Historical Provider, MD  methocarbamol (ROBAXIN) 500 MG tablet Take 1 tablet (500 mg total) by mouth 2 (two) times daily. Patient not taking: Reported on 11/24/2016 10/12/16   Elson AreasLeslie K Sofia, PA-C  oxyCODONE-acetaminophen (PERCOCET/ROXICET) 5-325 MG tablet Take 2 tablets by mouth every 4 (four) hours as needed for severe pain. Patient not taking: Reported on 11/24/2016 10/12/16   Elson AreasLeslie K Sofia, PA-C  predniSONE (DELTASONE) 10 MG tablet [phone],5,4,3,2,1 Patient not taking: Reported on 11/24/2016 10/12/16   Elson AreasLeslie K Sofia, PA-C    Family History History reviewed. No pertinent family history.  Social History Social History  Substance Use Topics  . Smoking status: Never Smoker  . Smokeless tobacco: Never Used  . Alcohol use Yes     Comment: occasional once on the weekends     Allergies   Codeine   Review of Systems Review of Systems All other systems negative except as documented in the HPI. All pertinent positives and negatives as reviewed in the HPI.  Physical Exam Updated Vital Signs BP 162/100 (BP Location: Left Arm)   Pulse 67   Temp 98.1 F (36.7 C) (Oral)  Resp 20   Ht 5\' 10"  (1.778 m)   Wt 110.2 kg   LMP 12/13/2016   SpO2 99%   BMI 34.87 kg/m   Physical Exam  Constitutional: She is oriented to person, place, and time. She appears well-developed and well-nourished. No distress.  HENT:  Head: Normocephalic and atraumatic.  Eyes: Pupils are equal, round, and reactive to light.  Pulmonary/Chest: Effort normal.  Musculoskeletal:       Thoracic back: She exhibits tenderness and pain. She exhibits normal range of motion, no bony tenderness, no swelling, no deformity and no spasm.       Back:  Neurological: She is alert and oriented to person, place, and time. She displays  normal reflexes. No sensory deficit. She exhibits normal muscle tone. Coordination normal.  Skin: Skin is warm and dry. Capillary refill takes less than 2 seconds.     ED Treatments / Results  Labs (all labs ordered are listed, but only abnormal results are displayed) Labs Reviewed - No data to display  EKG  EKG Interpretation None       Radiology No results found.  Procedures Procedures (including critical care time)  Medications Ordered in ED Medications - No data to display   Initial Impression / Assessment and Plan / ED Course  I have reviewed the triage vital signs and the nursing notes.  Pertinent labs & imaging results that were available during my care of the patient were reviewed by me and considered in my medical decision making (see chart for details).  Clinical Course      Patient be treated for strain in her thoracic region.  Advised her to follow up with her primary doctor, so she has an appointment coming up and that they may need to do a further MRI.  Patient has no neurological deficits.  Her reflexes are normal.  Patient also has good strength in her extremities. Final Clinical Impressions(s) / ED Diagnoses   Final diagnoses:  None    New Prescriptions New Prescriptions   No medications on file     Charlestine NightChristopher Kairon Shock, PA-C 12/16/16 0901    Blane OharaJoshua Zavitz, MD 12/16/16 [phone]

## 2016-12-16 NOTE — Discharge Instructions (Signed)
Return here as needed.  Follow-up with your primary care doctor.  Use ice and heat on your back °

## 2016-12-16 NOTE — ED Triage Notes (Signed)
PT c/o recurrent lower back pain with no new injury non-radiating worsening x3 days. PT ambulatory from triage with NAD noted.

## 2016-12-16 NOTE — ED Notes (Signed)
Patient given discharge instruction, verbalized understand. Patient ambulatory out of the department.  

## 2016-12-25 ENCOUNTER — Other Ambulatory Visit (HOSPITAL_COMMUNITY): Payer: Self-pay | Admitting: *Deleted

## 2016-12-25 DIAGNOSIS — Z1231 Encounter for screening mammogram for malignant neoplasm of breast: Secondary | ICD-10-CM

## 2016-12-30 NOTE — Telephone Encounter (Signed)
W

## 2017-01-01 ENCOUNTER — Ambulatory Visit: Admit: 2017-01-01 | Discharge: 2017-01-01 | Payer: PRIVATE HEALTH INSURANCE

## 2017-01-01 DIAGNOSIS — Z01419 Encounter for gynecological examination (general) (routine) without abnormal findings: Secondary | ICD-10-CM

## 2017-01-01 LAB — HPV HIGH RISK WITH GENOTYPING
HPV DNA High Risk Oth: NEGATIVE
HPV Genotype 16: NEGATIVE
HPV Genotype 18: NEGATIVE

## 2017-01-01 NOTE — Unmapped (Signed)
Beth Turner is a 46 y.o. G0P0000 who presents for annual well woman exam.  She recently moved to [place] from [facility].  Works as an Pharmacist, community.  Has hx endometriosis and menorrhagia/dysmennorhea.  Symptoms well-controlled on OCPs.  Not currently sexually active.  Declines STI testing.  Exercises regularly.    GYN History  Menses  LMP: Patient's last menstrual period was 12/18/2016 (within days).  Periods are regular, last 4 days. Issues none    Pap history  Hx abn pap, s/p colpo and LEEP in 2013.  Paps have been abn since with neg HPV.  -Patient desires yearly paps regardless of guidelines    Breast Health  Denies lumps, masses or pain    Sexual history  Not sexually active  Denies hx STIs    Contraception  OCPs as method  She is satisfied with this method.     Screening  Denies symptoms of depression  Denies domestic violence    History  No Known Allergies  No current outpatient prescriptions on file prior to visit.     No current facility-administered medications on file prior to visit.      Past Medical History:   Diagnosis Date   ??? Endometriosis    ??? Thyroid disease     Hypothryroid     Past Surgical History:   Procedure Laterality Date   ??? DIAGNOSTIC LAPAROSCOPY  2015    Uterine polyp removed      Social History   Substance Use Topics   ??? Smoking status: Never Smoker   ??? Smokeless tobacco: Never Used   ??? Alcohol use Yes      Comment: Social       ObGyn History  OB History     Gravida Para Term Preterm AB Living    0 0 0 0 0 0    SAB TAB Ectopic Multiple Live Births    0 0 0 0 0          ROS  Negative except as noted in HPI    Physical Exam  Vitals:    01/01/17 0812   BP: (!) 123/95   BP Location: Right arm   Patient Position: Sitting   BP Cuff Size: Regular   Pulse: 81   Weight: 118 lb 12.8 oz (53.9 kg)   Height: 5' 7 (1.702 m)     Body mass index is 18.61 kg/m??.    General appearance: alert, appears stated age and cooperative  Neck: thyroid not enlarged, symmetric, no tenderness/mass/nodules  Lungs: clear to  auscultation bilaterally and no respiratory distress  Breasts: normal appearance, no masses or tenderness  Heart: regular rate and rhythm, S1, S2 normal, no murmur, click, rub or gallop  Abdomen: soft, non-tender; bowel sounds normal; no masses,  no organomegaly  Pelvic: cervix normal in appearance, external genitalia normal, no adnexal masses or tenderness, no cervical motion tenderness, rectovaginal septum normal, uterus normal size, shape, and consistency and vagina normal without discharge  Extremities: extremities normal, atraumatic, no cyanosis or edema    A/P: 46 y.o.  G0 here for annual well-woman exam    - cervical cancer screening: done today    - STI screening was not performed today    - Contraception: OCP, info given re: Mirena    - screening mammography was ordered and SBE was encouraged today    - beneficial changes to diet and regular activity were discussed    - RTO in 1 yr or sooner prn if desires Mirena placement.  All questions answered.    Nils Flack Hoyle Barr, D.O.

## 2017-01-09 NOTE — Telephone Encounter (Signed)
Please inform patient of LSIL pap with negative HPV.  Given hx abnormal paps since LEEP, recommend colposcopy.  Please help patient schedule.    Kathyrn Sheriff Joycelyn Rua, D.O.

## 2017-01-14 NOTE — Telephone Encounter (Signed)
Reviewed message with pt. Pt scheduled for colpo 01/23/17

## 2017-01-14 NOTE — Unmapped (Signed)
Left message for patient to return call on voicemail.

## 2017-01-15 ENCOUNTER — Inpatient Hospital Stay
Admit: 2017-01-15 | Discharge: 2017-01-15 | Disposition: A | Discharge status: Home / Self Care | Payer: Legal Liability / Liability Insurance

## 2017-01-15 DIAGNOSIS — S80212A Abrasion, left knee, initial encounter: Secondary | ICD-10-CM

## 2017-01-15 MED ORDER — ibuprofen (ADVIL,MOTRIN) tablet 800 mg
400 | Freq: Once | ORAL | Status: AC
Start: 2017-01-15 — End: 2017-01-15
  Administered 2017-01-15: 15:00:00 800 mg via ORAL

## 2017-01-15 MED FILL — IBUPROFEN 400 MG TABLET: 400 400 MG | ORAL | Qty: 2
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
## 2017-01-15 NOTE — Unmapped (Signed)
Please follow-up with your primary care doctor as needed.    Please return to the emergency department if you have any worsening symptoms or have any other concerns.
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
## 2017-01-16 ENCOUNTER — Inpatient Hospital Stay: Admit: 2017-01-16 | Payer: PRIVATE HEALTH INSURANCE

## 2017-01-16 DIAGNOSIS — Z1231 Encounter for screening mammogram for malignant neoplasm of breast: Secondary | ICD-10-CM

## 2017-01-22 ENCOUNTER — Ambulatory Visit (HOSPITAL_COMMUNITY): Payer: PRIVATE HEALTH INSURANCE

## 2017-01-23 ENCOUNTER — Ambulatory Visit: Admit: 2017-01-23 | Payer: PRIVATE HEALTH INSURANCE

## 2017-01-23 DIAGNOSIS — R87612 Low grade squamous intraepithelial lesion on cytologic smear of cervix (LGSIL): Secondary | ICD-10-CM

## 2017-01-23 MED ORDER — NIKKI, 28, 3-0.02 mg per tablet
3-0.02 | PACK | Freq: Every day | ORAL | 12 refills | Status: AC
Start: 2017-01-23 — End: 2017-03-31

## 2017-01-23 NOTE — Unmapped (Signed)
Colposcopy    Indication: LSIL pap, neg HPV, longstanding hx dysplasia    UPT not done.  Patient refused.  On menses and no intercourse in 39yr  Written consent obtained after discussion of procedure, risks, benefits    Speculum placed in the vagina. Acetic acid applied to the cervix.   Acetowhite changes at 9 o'clock  Mosaicism absent  Punctation absent  Atypical vessels absent    SCJ transformation zone was visualized. Entirety of lesions was seen.     Biopsies were taken at 9 o'clock  ECC was performed.   Hemostasis obtained with Monsels.     Overall impression: adequate colpo, low grade dysplasia    Patient tolerated the procedure well.   Will contact patient with results within two weeks. Patient made aware to contact the office in 2 weeks if she has not received results.    Rx for  [facility] sent to [employer] E. Joycelyn Rua, D.O.

## 2017-01-28 ENCOUNTER — Inpatient Hospital Stay: Admit: 2017-01-28 | Payer: PRIVATE HEALTH INSURANCE

## 2017-02-04 NOTE — Telephone Encounter (Signed)
Attempted to call patient.  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  Plan for repeat pap smear with cotesting in 1 yr.    Kathyrn Sheriff Joycelyn Rua, D.O.

## 2017-02-06 NOTE — Telephone Encounter (Signed)
Spoke to PT and she was concerned about the CIN1 and didn't understand how that could be with negative on the ECC yet have it not be concerning.  I tried to explain it to her but she remained confused.

## 2017-02-10 NOTE — Unmapped (Signed)
Called patient and discussed colpo results in detail. ??Patient to have previous pap, colpo records sent to office. ??All questions answered     Beth Turner E. Joycelyn Rua, D.O.

## 2017-02-22 ENCOUNTER — Emergency Department (HOSPITAL_COMMUNITY)
Admission: EM | Admit: 2017-02-22 | Discharge: 2017-02-22 | Disposition: A | Discharge status: Home / Self Care | Payer: PRIVATE HEALTH INSURANCE | Attending: Emergency Medicine | Admitting: Emergency Medicine

## 2017-02-22 ENCOUNTER — Encounter (HOSPITAL_COMMUNITY): Payer: Self-pay | Admitting: Emergency Medicine

## 2017-02-22 DIAGNOSIS — T148XXA Other injury of unspecified body region, initial encounter: Secondary | ICD-10-CM

## 2017-02-22 DIAGNOSIS — Y990 Civilian activity done for income or pay: Secondary | ICD-10-CM | POA: Insufficient documentation

## 2017-02-22 DIAGNOSIS — I10 Essential (primary) hypertension: Secondary | ICD-10-CM | POA: Insufficient documentation

## 2017-02-22 DIAGNOSIS — Z79899 Other long term (current) drug therapy: Secondary | ICD-10-CM | POA: Insufficient documentation

## 2017-02-22 DIAGNOSIS — X500XXA Overexertion from strenuous movement or load, initial encounter: Secondary | ICD-10-CM | POA: Insufficient documentation

## 2017-02-22 DIAGNOSIS — S29012A Strain of muscle and tendon of back wall of thorax, initial encounter: Secondary | ICD-10-CM | POA: Insufficient documentation

## 2017-02-22 DIAGNOSIS — Y93F2 Activity, caregiving, lifting: Secondary | ICD-10-CM | POA: Insufficient documentation

## 2017-02-22 DIAGNOSIS — Y929 Unspecified place or not applicable: Secondary | ICD-10-CM | POA: Insufficient documentation

## 2017-02-22 HISTORY — DX: Other chronic pain: G89.29

## 2017-02-22 HISTORY — DX: Dorsalgia, unspecified: M54.9

## 2017-02-22 MED ORDER — TRAMADOL HCL 50 MG PO TABS: 50.0000 mg | ORAL_TABLET | Freq: Four times a day (QID) | ORAL | 0 refills | Status: DC | PRN

## 2017-02-22 MED ORDER — TRAMADOL HCL 50 MG PO TABS
50.0000 mg | ORAL_TABLET | Freq: Once | ORAL | Status: AC
  Administered 2017-02-22: 50 mg via ORAL
  Filled 2017-02-22: qty 1

## 2017-02-22 MED ORDER — CYCLOBENZAPRINE HCL 10 MG PO TABS
10.0000 mg | ORAL_TABLET | Freq: Once | ORAL | Status: AC
  Administered 2017-02-22: 10 mg via ORAL
  Filled 2017-02-22: qty 1

## 2017-02-22 MED ORDER — NAPROXEN 500 MG PO TABS
500.0000 mg | ORAL_TABLET | Freq: Two times a day (BID) | ORAL | 0 refills | Status: DC
Start: 2017-02-22 — End: 2017-06-19

## 2017-02-22 MED ORDER — CYCLOBENZAPRINE HCL 10 MG PO TABS: 10.0000 mg | ORAL_TABLET | Freq: Three times a day (TID) | ORAL | 0 refills | Status: DC | PRN

## 2017-02-22 NOTE — ED Triage Notes (Signed)
Patient c/o right upper back pain that radiates into neck. Per patient chronic pain that is intermittent x7 years. Patient states this particular episode x3 weeks. Patient reports taking nsaids, back and body, and tylenol with no relief. Denies any numbness and tingling.

## 2017-02-22 NOTE — ED Provider Notes (Signed)
[facility] DEPT Provider Note   CSN: [id number] Arrival date & time: 02/22/17  1629  By signing my name below, I, Cynda AcresHailei Fulton, attest that this documentation has been prepared under the direction and in the presence of Marchel Foote PA-C. Electronically Signed: Cynda AcresHailei Fulton, Scribe. 02/22/17. 4:59 PM.  History   Chief Complaint Chief Complaint  Patient presents with  . Back Pain    HPI Comments: Marie Oliver is a 46 y.o. female with a hx of chornic back pain, who presents to the Emergency Department complaining of sudden-onset, constant right upper back pain that began 3 weeks ago. Patient reports lifting of boxes that were approximately 40 pounds each at work at onset.  Patient reports placing ice and heat on the area with minimal relief. Patient states the pain is worse with certain movement. Patient describes her pain as sharp/shooting with a severity of 10/10. Patient reports receiving Tramadol recently. Patient states she is currently trying to get into a pain clinic for this.  Patient denies any loss of bladder/bowel control, fever, chills, numbness or weakness of the extremities or abdominal pain.  The history is provided by the patient. No language interpreter was used.    Past Medical History:  Diagnosis Date  . Chronic back pain   . Hypertension     Patient Active Problem List   Diagnosis Date Noted  . Displaced bimalleolar fracture of right ankle 08/20/2015    Past Surgical History:  Procedure Laterality Date  . ORIF ANKLE FRACTURE Bilateral 08/20/2015   Procedure: OPEN REDUCTION INTERNAL FIXATION (ORIF) ANKLE FRACTURE MALLEOLAR;  Surgeon: Jodi GeraldsJohn Graves, MD;  Location: [facility] OR;  Service: Orthopedics;  Laterality: Bilateral;  . TUBAL LIGATION      OB History    Gravida Para Term Preterm AB Living             2   SAB TAB Ectopic Multiple Live Births                   Home Medications    Prior to Admission medications   Medication Sig Start Date End Date  Taking? Authorizing Provider  cyclobenzaprine (FLEXERIL) 10 MG tablet Take 1 tablet (10 mg total) by mouth at bedtime. 12/16/16   Christopher Lawyer, PA-C  diazepam (VALIUM) 5 MG tablet Take 5 mg by mouth every 6 (six) hours as needed for anxiety.    Historical Provider, MD  HYDROcodone-acetaminophen (NORCO) 7.5-325 MG tablet Take 1 tablet by mouth every 6 (six) hours as needed for moderate pain. 11/24/16   Bryanda Mikel, PA-C  ketorolac (TORADOL) 10 MG tablet Take 10 mg by mouth every 6 (six) hours as needed.    Historical Provider, MD  methocarbamol (ROBAXIN) 500 MG tablet Take 1 tablet (500 mg total) by mouth 2 (two) times daily. Patient not taking: Reported on 11/24/2016 10/12/16   Elson AreasLeslie K Sofia, PA-C  oxyCODONE-acetaminophen (PERCOCET/ROXICET) 5-325 MG tablet Take 2 tablets by mouth every 4 (four) hours as needed for severe pain. Patient not taking: Reported on 11/24/2016 10/12/16   Elson AreasLeslie K Sofia, PA-C  predniSONE (DELTASONE) 50 MG tablet Take 1 tablet (50 mg total) by mouth daily with breakfast. 12/16/16   Charlestine Nighthristopher Lawyer, PA-C  traMADol (ULTRAM) 50 MG tablet Take 1 tablet (50 mg total) by mouth every 6 (six) hours as needed for severe pain. 12/16/16   Charlestine Nighthristopher Lawyer, PA-C    Family History History reviewed. No pertinent family history.  Social History Social History  Substance Use Topics  .  Smoking status: Never Smoker  . Smokeless tobacco: Never Used  . Alcohol use Yes     Comment: occasional once on the weekends     Allergies   Codeine   Review of Systems Review of Systems  Constitutional: Negative for fever.  Respiratory: Negative for chest tightness and shortness of breath.   Cardiovascular: Negative for chest pain.  Gastrointestinal: Negative for abdominal pain, constipation and vomiting.  Genitourinary: Negative for decreased urine volume, difficulty urinating, dysuria, flank pain and hematuria.  Musculoskeletal: Positive for back pain (lower). Negative for  joint swelling.  Skin: Negative for rash.  Neurological: Negative for weakness and numbness.  All other systems reviewed and are negative.    Physical Exam Updated Vital Signs BP 155/64 (BP Location: Left Arm)   Pulse 97   Temp 97.4 F (36.3 C) (Oral)   Resp 18   Ht 5\' 11"  (1.803 m)   Wt 227 lb 12.8 oz (103.3 kg)   LMP 02/15/2017   SpO2 100%   BMI 31.77 kg/m   Physical Exam  Constitutional: She is oriented to person, place, and time. She appears well-developed and well-nourished. No distress.  HENT:  Head: Normocephalic and atraumatic.  Neck: Normal range of motion. Neck supple.  Cardiovascular: Normal rate, regular rhythm and intact distal pulses.   Pulmonary/Chest: Effort normal and breath sounds normal. She exhibits no tenderness.  Abdominal: Soft. She exhibits no distension. There is no tenderness. There is no guarding.  Musculoskeletal: Normal range of motion.       Back:       Arms: Focal tenderness to the musculature just below the right scapula. No spinal tenderness or bony step-offs  Neurological: She is alert and oriented to person, place, and time.  Skin: Skin is warm and dry. No rash noted.     ED Treatments / Results  DIAGNOSTIC STUDIES: Oxygen Saturation is 100% on RA, normal by my interpretation.    COORDINATION OF CARE: 4:58 PM Discussed treatment plan with pt at bedside and pt agreed to plan, which includes a muscle relaxer and pain medication.   Labs (all labs ordered are listed, but only abnormal results are displayed) Labs Reviewed - No data to display  EKG  EKG Interpretation None       Radiology No results found.  Procedures Procedures (including critical care time)  Medications Ordered in ED Medications  traMADol (ULTRAM) tablet 50 mg (50 mg Oral Given 02/22/17 1706)  cyclobenzaprine (FLEXERIL) tablet 10 mg (10 mg Oral Given 02/22/17 1706)     Initial Impression / Assessment and Plan / ED Course  I have reviewed the triage  vital signs and the nursing notes.  Pertinent labs & imaging results that were available during my care of the patient were reviewed by me and considered in my medical decision making (see chart for details).    Patient reviewed on the [place] database, patient received #20 Tramadol in December 2017. Pt with recurrent muscular tenderness on exam.  Seen here Dec 2017 for same. No focal neuro deficits, no motor weakness. Agrees to tx plan and close PCP f/u, return precautions given.     Final Clinical Impressions(s) / ED Diagnoses   Final diagnoses:  Muscle strain    New Prescriptions Discharge Medication List as of 02/22/2017  5:05 PM    START taking these medications   Details  naproxen (NAPROSYN) 500 MG tablet Take 1 tablet (500 mg total) by mouth 2 (two) times daily with a meal., [place]  02/22/2017, Print       I personally performed the services described in this documentation, which was scribed in my presence. The recorded information has been reviewed and is accurate.'    Pauline Aus, PA-C 02/23/17 2227    Marily Memos, MD 02/23/17 2326

## 2017-02-22 NOTE — Discharge Instructions (Signed)
Apply ice packs on/off to your back.  Follow-up with your PCP for recheck if needed.

## 2017-02-22 NOTE — ED Notes (Signed)
Pt report "chronic" back pain for the last 8 years-  She reports no physician

## 2017-02-27 ENCOUNTER — Encounter

## 2017-03-27 NOTE — Telephone Encounter (Signed)
Pt called stating her current BCP script is $50 for every RF. States that the pharmacy has attempted to contact provider regarding script cost. Requesting to get it clarified. Advised pt this RN would contact pharmacy to get clarification. Pt verbalized understanding. Spoke to pharmacy, pharmacist states that her insurance no longer covers BCP being sent to pharmacy and will need to go through mail order service. Attempted to contact pt. LVMTCB to find out which mail order service she would like to use.

## 2017-03-27 NOTE — Telephone Encounter (Signed)
Patient needs an new rx sent to mail order pharmacy [facility] in [facility]) please send in today as patient has been paying $50 out of pocket per month. It also can be called in to pharmacy at [phone]

## 2017-03-31 MED ORDER — NIKKI, 28, 3-0.02 mg per tablet
3-0.02 | PACK | Freq: Every day | ORAL | 12 refills | Status: AC
Start: 2017-03-31 — End: 2017-08-17

## 2017-03-31 NOTE — Telephone Encounter (Signed)
Rx sent to pharmacy as requested.  Please inform patient.

## 2017-04-01 NOTE — Telephone Encounter (Signed)
Pt informed her prescription was sent to [facility] as requested.  She voiced understanding.

## 2017-04-07 NOTE — Unmapped (Signed)
[facility] requests authorization to change quantity of Beth Turner 3-20 mg tablets to #90, use reference number when calling back #:[id number].

## 2017-04-08 NOTE — Telephone Encounter (Signed)
Ok for #90 day RX wit refills x 1 year per Dr Joycelyn Rua. Called [facility] and spoke with rep [place] [phone]

## 2017-05-11 ENCOUNTER — Ambulatory Visit: Payer: PRIVATE HEALTH INSURANCE | Attending: "Endocrinology

## 2017-06-19 ENCOUNTER — Encounter (HOSPITAL_COMMUNITY): Payer: Self-pay

## 2017-06-19 ENCOUNTER — Emergency Department (HOSPITAL_COMMUNITY)
Admission: EM | Admit: 2017-06-19 | Discharge: 2017-06-19 | Disposition: A | Discharge status: Home / Self Care | Payer: PRIVATE HEALTH INSURANCE | Attending: Emergency Medicine | Admitting: Emergency Medicine

## 2017-06-19 DIAGNOSIS — Y929 Unspecified place or not applicable: Secondary | ICD-10-CM | POA: Insufficient documentation

## 2017-06-19 DIAGNOSIS — X501XXA Overexertion from prolonged static or awkward postures, initial encounter: Secondary | ICD-10-CM | POA: Insufficient documentation

## 2017-06-19 DIAGNOSIS — Y999 Unspecified external cause status: Secondary | ICD-10-CM | POA: Insufficient documentation

## 2017-06-19 DIAGNOSIS — T148XXA Other injury of unspecified body region, initial encounter: Secondary | ICD-10-CM

## 2017-06-19 DIAGNOSIS — Z79899 Other long term (current) drug therapy: Secondary | ICD-10-CM | POA: Insufficient documentation

## 2017-06-19 DIAGNOSIS — I10 Essential (primary) hypertension: Secondary | ICD-10-CM | POA: Insufficient documentation

## 2017-06-19 DIAGNOSIS — S29012A Strain of muscle and tendon of back wall of thorax, initial encounter: Secondary | ICD-10-CM | POA: Insufficient documentation

## 2017-06-19 DIAGNOSIS — Y939 Activity, unspecified: Secondary | ICD-10-CM | POA: Insufficient documentation

## 2017-06-19 MED ORDER — TRAMADOL HCL 50 MG PO TABS: 50.0000 mg | ORAL_TABLET | Freq: Four times a day (QID) | ORAL | 0 refills | Status: DC | PRN

## 2017-06-19 MED ORDER — NAPROXEN 500 MG PO TABS: 500.0000 mg | ORAL_TABLET | Freq: Two times a day (BID) | ORAL | 0 refills | Status: DC

## 2017-06-19 MED ORDER — CYCLOBENZAPRINE HCL 10 MG PO TABS: 10.0000 mg | ORAL_TABLET | Freq: Three times a day (TID) | ORAL | 0 refills | Status: DC | PRN

## 2017-06-19 NOTE — Discharge Instructions (Signed)
Packs on and off to your upper back. Call the clinic listed to arrange a follow-up appointment if needed.

## 2017-06-19 NOTE — ED Triage Notes (Addendum)
Pt states she has hx of bulging disc in neck. Pain in right back below shoulder blade that began approx 1week ago. Pain flares up intermittently over the last 10 yrs . Pt reports recently started a new job and does a lot of bending which has aggravated back

## 2017-06-19 NOTE — ED Provider Notes (Signed)
[facility] Provider Note   CSN: [id number] Arrival date & time: 06/19/17  4098     History   Chief Complaint Chief Complaint  Patient presents with  . Back Pain    HPI Marie Oliver is a 46 y.o. female.  HPI   Marie Oliver is a 46 y.o. female with hx of chronic right upper back pain, presents to the Emergency Department complaining of worsening pain for one week.  She states that she recently began a new job which requires bending and reaching with the right arm. She describes a sharp burning pain just below the right shoulder blade that is aggravated by certain movements and raising the right arm. She is currently taking over-the-counter anti-inflammatories with minimal relief. She states the pain is similar to her chronic upper back pain. Intermittent numbness and tingling into the right arm. She denies numbness, swelling, headaches, dizziness, and extremity weakness.  Past Medical History:  Diagnosis Date  . Chronic back pain   . Hypertension     Patient Active Problem List   Diagnosis Date Noted  . Displaced bimalleolar fracture of right ankle 08/20/2015    Past Surgical History:  Procedure Laterality Date  . ORIF ANKLE FRACTURE Bilateral 08/20/2015   Procedure: OPEN REDUCTION INTERNAL FIXATION (ORIF) ANKLE FRACTURE MALLEOLAR;  Surgeon: Jodi Geralds, MD;  Location: [facility] OR;  Service: Orthopedics;  Laterality: Bilateral;  . TUBAL LIGATION      OB History    Gravida Para Term Preterm AB Living             2   SAB TAB Ectopic Multiple Live Births                   Home Medications    Prior to Admission medications   Medication Sig Start Date End Date Taking? Authorizing Provider  cyclobenzaprine (FLEXERIL) 10 MG tablet Take 1 tablet (10 mg total) by mouth 3 (three) times daily as needed. For muscle spasms 02/22/17   Tailor Lucking, PA-C  diazepam (VALIUM) 5 MG tablet Take 5 mg by mouth every 6 (six) hours as needed for anxiety.    [provider]  HYDROcodone-acetaminophen (NORCO) 7.5-325 MG tablet Take 1 tablet by mouth every 6 (six) hours as needed for moderate pain. 11/24/16   Abdikadir Fohl, PA-C  ketorolac (TORADOL) 10 MG tablet Take 10 mg by mouth every 6 (six) hours as needed.    [provider]  naproxen (NAPROSYN) 500 MG tablet Take 1 tablet (500 mg total) by mouth 2 (two) times daily with a meal. 02/22/17   Salbador Fiveash, PA-C  oxyCODONE-acetaminophen (PERCOCET/ROXICET) 5-325 MG tablet Take 2 tablets by mouth every 4 (four) hours as needed for severe pain. Patient not taking: Reported on 11/24/2016 10/12/16   Elson Areas, PA-C  predniSONE (DELTASONE) 50 MG tablet Take 1 tablet (50 mg total) by mouth daily with breakfast. 12/16/16   Lawyer, Cristal Deer, PA-C  traMADol (ULTRAM) 50 MG tablet Take 1 tablet (50 mg total) by mouth every 6 (six) hours as needed. 02/22/17   Pauline Aus, PA-C    Family History No family history on file.  Social History Social History  Substance Use Topics  . Smoking status: Never Smoker  . Smokeless tobacco: Never Used  . Alcohol use Yes     Comment: occasional once on the weekends     Allergies   Codeine   Review of Systems Review of Systems  Constitutional: Negative for fever.  Eyes:  Negative for visual disturbance.  Respiratory: Negative for shortness of breath.   Cardiovascular: Negative for chest pain.  Gastrointestinal: Negative for abdominal pain, constipation and vomiting.  Genitourinary: Negative for decreased urine volume, difficulty urinating, dysuria, flank pain and hematuria.  Musculoskeletal: Positive for back pain (Right upper back pain). Negative for joint swelling, neck pain and neck stiffness.  Skin: Negative for rash.  Neurological: Negative for dizziness, syncope, speech difficulty, weakness, numbness and headaches.  All other systems reviewed and are negative.    Physical Exam Updated Vital Signs BP 138/76 (BP Location: Left Arm)   Pulse  69   Temp 98.5 F (36.9 C) (Oral)   Resp 16   Ht 5\' 11"  (1.803 m)   Wt 108 kg (238 lb)   LMP 06/12/2017   SpO2 100%   BMI 33.19 kg/m   Physical Exam  Constitutional: She is oriented to person, place, and time. She appears well-developed and well-nourished. No distress.  HENT:  Head: Normocephalic and atraumatic.  Mouth/Throat: Oropharynx is clear and moist.  Eyes: EOM are normal. Pupils are equal, round, and reactive to light.  Neck: Normal range of motion. Neck supple.  Cardiovascular: Normal rate, regular rhythm, normal heart sounds and intact distal pulses.   No murmur heard. Pulmonary/Chest: Effort normal and breath sounds normal. No respiratory distress. She exhibits no tenderness.  Abdominal: Soft. She exhibits no distension. There is no tenderness.  Musculoskeletal: She exhibits tenderness. She exhibits no edema.       Lumbar back: She exhibits tenderness and pain. She exhibits normal range of motion, no swelling, no deformity, no laceration and normal pulse.       Back:  ttp of the muscles along the lower right scapular border.  No spinal tenderness.  Pt has 5/5 strength against resistance of bilateral upper extremities.     Neurological: She is alert and oriented to person, place, and time. She has normal strength. No sensory deficit. She exhibits normal muscle tone. Coordination and gait normal. GCS eye subscore is 4. GCS verbal subscore is 5. GCS motor subscore is 6.  Reflex Scores:      Patellar reflexes are 2+ on the right side and 2+ on the left side.      Achilles reflexes are 2+ on the right side and 2+ on the left side. CN II-XII intact  Skin: Skin is warm and dry. Capillary refill takes less than 2 seconds. No rash noted.  Nursing note and vitals reviewed.    ED Treatments / Results  Labs (all labs ordered are listed, but only abnormal results are displayed) Labs Reviewed - No data to display  EKG  EKG Interpretation None       Radiology No results  found.  Procedures Procedures (including critical care time)  Medications Ordered in ED Medications - No data to display   Initial Impression / Assessment and Plan / ED Course  I have reviewed the triage vital signs and the nursing notes.  Pertinent labs & imaging results that were available during my care of the patient were reviewed by me and considered in my medical decision making (see chart for details).     Patient is well-appearing. No focal neuro deficits. Pain along the right scapular border is likely musculoskeletal. Patient agrees to ice, NSAIDs, prescription for Ultram and muscle relaxer. Referral information given to establish PCP  Final Clinical Impressions(s) / ED Diagnoses   Final diagnoses:  Muscle strain    New Prescriptions New Prescriptions  No medications on file     Pauline Ausriplett, [place], Cordelia Poche-C 06/19/17 [id number]    Mancel BaleWentz, Elliott, MD 06/19/17 [phone]

## 2017-08-13 NOTE — Unmapped (Signed)
Pt reports hair loss and undesired side effects with [employer] Rx for [employer]- did not have any issues with this Rx  Pt wants to be notified when Rx has been changed  Pharmacy verified in chart

## 2017-08-17 MED ORDER — drospirenone-ethinyl estradiol (YAZ) 3-0.02 mg per tablet
3-0.02 | PACK | Freq: Every day | ORAL | 9 refills | Status: AC
Start: 2017-08-17 — End: 2017-09-15

## 2017-08-17 NOTE — Unmapped (Signed)
Informed pt new Rx written for DAW  Pt verbalized understanding

## 2017-08-20 NOTE — Telephone Encounter (Signed)
Pt calling because she thinks she is having a side effect of the birth control. She states her hair is thinning on [place]. She is asking if she should stop it? Significantly worse over 3 weeks.    Pt states she did move from [place] to [place] a year ago. But denies any other stressors. Also denies changes in medications.     Pt prefers phone calls instead of mychart messages (she cannot recall pw).

## 2017-08-24 NOTE — Telephone Encounter (Signed)
Also see Pt's mychart message

## 2017-09-01 NOTE — Unmapped (Signed)
Called and discussed with patient.  Was previously on Vestura since 2014 with no issues.  Was switched to [place] just over 57yr ago.  Complains of hair thinning x1 yr.  Initially attributed it to stress of moving.  Life now settled and still having issues.  Noticeable increase in thinning the past 1-2 months.  She was switched back to [place] a few weeks ago.  Discussed that possibly due to birth control.  Options include stopping OCPs and observing for resolution of hair thinning or continuing Vestura to see if hair thinning resolves after changing pill.  Patient very hesitant to stop pill because periods were terrible.  Plans to continue Vestura.  Will touch base in 1-2 months to reassess.  All questions answered.    Kathyrn Sheriff Joycelyn Rua, D.O.

## 2017-09-02 NOTE — Telephone Encounter (Signed)
See telephone call from Dr. Joycelyn Rua on 09/01/17

## 2017-09-15 NOTE — Unmapped (Signed)
Pt states [facility] is requiring her birth control Rx to be written to dispense a 90 day supply at a time. Updated Rx needs sent to [facility]. Pt would like a call when Rx is sent in.

## 2017-09-16 MED ORDER — drospirenoneethinylestradiolYAZ3002mgpertablet
3-0.02 | PACK | Freq: Every day | ORAL | 4 refills | Status: AC
Start: 2017-09-16 — End: 2017-12-17

## 2017-09-16 NOTE — Unmapped (Signed)
Updated Rx sent to [facility].  Please inform patient.    Kathyrn Sheriff Joycelyn Rua, D.O.

## 2017-09-18 NOTE — Telephone Encounter (Signed)
Called pt & informed her updated 90 day supply Rx was sent to [facility] on 09/16/17. Pt verbalized understanding.

## 2017-09-19 ENCOUNTER — Encounter (HOSPITAL_COMMUNITY): Payer: Self-pay | Admitting: Emergency Medicine

## 2017-09-19 ENCOUNTER — Emergency Department (HOSPITAL_COMMUNITY)
Admission: EM | Admit: 2017-09-19 | Discharge: 2017-09-19 | Disposition: A | Discharge status: Home / Self Care | Payer: Self-pay | Attending: Emergency Medicine | Admitting: Emergency Medicine

## 2017-09-19 DIAGNOSIS — M62830 Muscle spasm of back: Secondary | ICD-10-CM | POA: Insufficient documentation

## 2017-09-19 DIAGNOSIS — I10 Essential (primary) hypertension: Secondary | ICD-10-CM | POA: Insufficient documentation

## 2017-09-19 DIAGNOSIS — Z79899 Other long term (current) drug therapy: Secondary | ICD-10-CM | POA: Insufficient documentation

## 2017-09-19 MED ORDER — ONDANSETRON HCL 4 MG PO TABS
4.0000 mg | ORAL_TABLET | Freq: Once | ORAL | Status: AC
  Administered 2017-09-19: 4 mg via ORAL
  Filled 2017-09-19: qty 1

## 2017-09-19 MED ORDER — KETOROLAC TROMETHAMINE 60 MG/2ML IM SOLN
60.0000 mg | Freq: Once | INTRAMUSCULAR | Status: AC
  Administered 2017-09-19: 60 mg via INTRAMUSCULAR
  Filled 2017-09-19: qty 2

## 2017-09-19 MED ORDER — DEXAMETHASONE SODIUM PHOSPHATE 10 MG/ML IJ SOLN
10.0000 mg | Freq: Once | INTRAMUSCULAR | Status: AC
  Administered 2017-09-19: 10 mg via INTRAMUSCULAR
  Filled 2017-09-19: qty 1

## 2017-09-19 MED ORDER — CYCLOBENZAPRINE HCL 10 MG PO TABS: 10.0000 mg | ORAL_TABLET | Freq: Three times a day (TID) | ORAL | 0 refills | Status: DC

## 2017-09-19 MED ORDER — DIAZEPAM 5 MG PO TABS
10.0000 mg | ORAL_TABLET | Freq: Once | ORAL | Status: AC
  Administered 2017-09-19: 10 mg via ORAL
  Filled 2017-09-19: qty 2

## 2017-09-19 MED ORDER — DICLOFENAC SODIUM 75 MG PO TBEC: 75.0000 mg | DELAYED_RELEASE_TABLET | Freq: Two times a day (BID) | ORAL | 0 refills | Status: DC

## 2017-09-19 MED ORDER — DEXAMETHASONE 4 MG PO TABS: 4.0000 mg | ORAL_TABLET | Freq: Two times a day (BID) | ORAL | 0 refills | Status: DC

## 2017-09-19 NOTE — ED Provider Notes (Signed)
[facility]EM[facility]Y DEPT Provider Note   CSN: [id number] Arrival date & time: 09/19/17  4098     History   Chief Complaint Chief Complaint  Patient presents with  . Back Pain    HPI Marie Oliver is a 46 y.o. female.  The history is provided by the patient.  Back Pain   This is a chronic problem. The current episode started more than 1 week ago. The problem occurs daily. The problem has been gradually worsening. The pain is associated with no known injury. The pain is present in the thoracic spine (upper back pain). The quality of the pain is described as burning (numbing sensation.). Radiates to: area under the shoulder blade. The pain is at a severity of 10/10. Exacerbated by: standing still or lying still. The pain is the same all the time. Associated symptoms include numbness and tingling. Pertinent negatives include no chest pain, no fever, no abdominal pain, no dysuria and no weakness. She has tried NSAIDs for the symptoms. The treatment provided no relief.    Past Medical History:  Diagnosis Date  . Chronic back pain   . Hypertension     Patient Active Problem List   Diagnosis Date Noted  . Displaced bimalleolar fracture of right ankle 08/20/2015    Past Surgical History:  Procedure Laterality Date  . ORIF ANKLE FRACTURE Bilateral 08/20/2015   Procedure: OPEN REDUCTION INTERNAL FIXATION (ORIF) ANKLE FRACTURE MALLEOLAR;  Surgeon: Jodi Geralds, MD;  Location: [facility] OR;  Service: Orthopedics;  Laterality: Bilateral;  . TUBAL LIGATION      OB History    Gravida Para Term Preterm AB Living             2   SAB TAB Ectopic Multiple Live Births                   Home Medications    Prior to Admission medications   Medication Sig Start Date End Date Taking? Authorizing Provider  aspirin 325 MG tablet Take 2,600 mg by mouth daily as needed for moderate pain.   Yes [provider]  ibuprofen (ADVIL,MOTRIN) 200 MG tablet Take 800 mg by mouth every 4 (four) hours as  needed for moderate pain.   Yes [provider]  cyclobenzaprine (FLEXERIL) 10 MG tablet Take 1 tablet (10 mg total) by mouth 3 (three) times daily as needed. Patient not taking: Reported on 09/19/2017 06/19/17   Triplett, Tammy, PA-C  diazepam (VALIUM) 5 MG tablet Take 5 mg by mouth every 6 (six) hours as needed for anxiety.    [provider]  HYDROcodone-acetaminophen (NORCO) 7.5-325 MG tablet Take 1 tablet by mouth every 6 (six) hours as needed for moderate pain. 11/24/16   Triplett, Tammy, PA-C  ketorolac (TORADOL) 10 MG tablet Take 10 mg by mouth every 6 (six) hours as needed.    [provider]  naproxen (NAPROSYN) 500 MG tablet Take 1 tablet (500 mg total) by mouth 2 (two) times daily. Take with food 06/19/17   Triplett, Tammy, PA-C  predniSONE (DELTASONE) 50 MG tablet Take 1 tablet (50 mg total) by mouth daily with breakfast. Patient not taking: Reported on 09/19/2017 12/16/16   Charlestine Night, PA-C  traMADol (ULTRAM) 50 MG tablet Take 1 tablet (50 mg total) by mouth every 6 (six) hours as needed. Patient not taking: Reported on 09/19/2017 06/19/17   Pauline Aus, PA-C    Family History History reviewed. No pertinent family history.  Social History Social History  Substance  Use Topics  . Smoking status: Never Smoker  . Smokeless tobacco: Never Used  . Alcohol use Yes     Comment: occasional once on the weekends     Allergies   Codeine   Review of Systems Review of Systems  Constitutional: Negative for activity change and fever.       All ROS Neg except as noted in HPI  HENT: Negative for nosebleeds.   Eyes: Negative for photophobia and discharge.  Respiratory: Negative for cough, shortness of breath and wheezing.   Cardiovascular: Negative for chest pain and palpitations.  Gastrointestinal: Negative for abdominal pain and blood in stool.  Genitourinary: Negative for dysuria, frequency and hematuria.  Musculoskeletal: Positive for back  pain. Negative for arthralgias and neck pain.  Skin: Negative.   Neurological: Positive for tingling and numbness. Negative for dizziness, seizures, speech difficulty and weakness.  Psychiatric/Behavioral: Negative for confusion and hallucinations.     Physical Exam Updated Vital Signs BP 133/83 (BP Location: Right Arm)   Pulse 80   Temp 98 F (36.7 C) (Oral)   Resp 16   Ht  (1.778 m)   Wt 106.6 kg (235 lb)   LMP 08/29/2017   SpO2 100%   BMI 33.72 kg/m   Physical Exam  Constitutional: She is oriented to person, place, and time. She appears well-developed and well-nourished.  Non-toxic appearance.  HENT:  Head: Normocephalic.  Right Ear: Tympanic membrane and external ear normal.  Left Ear: Tympanic membrane and external ear normal.  Eyes: Pupils are equal, round, and reactive to light. EOM and lids are normal.  Neck: Normal range of motion. Neck supple. Carotid bruit is not present.  Cardiovascular: Normal rate, regular rhythm, normal heart sounds, intact distal pulses and normal pulses.   Pulmonary/Chest: Breath sounds normal. No respiratory distress.  Abdominal: Soft. Bowel sounds are normal. There is no tenderness. There is no guarding.  Musculoskeletal: Normal range of motion.       Cervical back: She exhibits spasm.       Back:  There is pain and spasm of the right upper trapezius extending to the scapula and just below the scapula.  Lymphadenopathy:       Head (right side): No submandibular adenopathy present.       Head (left side): No submandibular adenopathy present.    She has no cervical adenopathy.  Neurological: She is alert and oriented to person, place, and time. She has normal strength. No cranial nerve deficit or sensory deficit.  Grip is symmetrical. There no sensory or motor deficits of the upper extremities. There is no  atrophy of the thenar eminence.  Skin: Skin is warm and dry.  Psychiatric: She has a normal mood and affect. Her speech is  normal.  Nursing note and vitals reviewed.    ED Treatments / Results  Labs (all labs ordered are listed, but only abnormal results are displayed) Labs Reviewed - No data to display  EKG  EKG Interpretation None       Radiology No results found.  Procedures Procedures (including critical care time)  Medications Ordered in ED Medications - No data to display   Initial Impression / Assessment and Plan / ED Course  I have reviewed the triage vital signs and the nursing notes.  Pertinent labs & imaging results that were available during my care of the patient were reviewed by me and considered in my medical decision making (see chart for details).       Final  Clinical Impressions(s) / ED Diagnoses MDM Vital signs within normal limits. Pulse oximetry 100% on room air. Within normal limits by my interpretation. Patient has a history of chronic back pain. This pain is similar to previous back issues. No gross neurologic deficit appreciated on today's examination. There is spasm noted at multiple areas. Patient will be treated with anti-inflammatory pain medication and Flexeril. Patient is asked to use heat over the next 3 days, and then alternate heat and ice to comfort. Patient is referred to [facility] for orthopedic evaluation if not improving.   Final diagnoses:  Muscle spasm of back    New Prescriptions New Prescriptions   CYCLOBENZAPRINE (FLEXERIL) 10 MG TABLET    Take 1 tablet (10 mg total) by mouth 3 (three) times daily.   DEXAMETHASONE (DECADRON) 4 MG TABLET    Take 1 tablet (4 mg total) by mouth 2 (two) times daily with a meal.   DICLOFENAC (VOLTAREN) 75 MG EC TABLET    Take 1 tablet (75 mg total) by mouth 2 (two) times daily.     Ivery Quale, PA-C 09/19/17 1106    Donnetta Hutching, MD 09/20/17 [phone]

## 2017-09-19 NOTE — ED Notes (Signed)
ED Provider at bedside. 

## 2017-09-19 NOTE — ED Triage Notes (Signed)
Pt reports flare of chronic back pain.  States every 4-6 weeks she gets an episode of pain.  Has been increasing x 3 weeks with no injury.

## 2017-09-19 NOTE — Discharge Instructions (Signed)
Your vital signs within normal limits. There no acute neurologic changes appreciated on your examination today. Your examination doesn't favor spasm of multiple areas involving your upper and mid back. Please use a heating pad over the next 3 days. You may then alternate heat and ice for comfort. Please use Decadron and diclofenac 2 times daily with a meal. Use Flexeril 3 times daily for spasm. Flexeril may cause drowsiness, and/or lightheadedness. Please do not drive a vehicle, operating machinery, or participate in activities requiring concentration when taking this medication.please see Dr. Magnus Ivan or the orthopedic specialist of your choice if the symptoms are not improving.

## 2017-12-10 NOTE — Unmapped (Signed)
[facility] calling to report that Beth Turner has been discontinued  Asking for alternative  Please call [phone]  Ref # [id number]

## 2017-12-15 NOTE — Unmapped (Signed)
[facility] calling to see if new Rx can be prescribed as Beth Turner has been dc'd

## 2017-12-17 MED ORDER — drospirenoneethinylestradiolYAZ3002mgpertablet
3-0.02 | PACK | Freq: Every day | ORAL | 4 refills | Status: AC
Start: 2017-12-17 — End: ?

## 2017-12-18 NOTE — Unmapped (Signed)
Informed pt [facility] dc'd, new OCP Rx sent  Pt verbalized understanding

## 2018-04-24 ENCOUNTER — Other Ambulatory Visit: Payer: Self-pay

## 2018-04-24 ENCOUNTER — Encounter (HOSPITAL_COMMUNITY): Payer: Self-pay | Admitting: Emergency Medicine

## 2018-04-24 ENCOUNTER — Emergency Department (HOSPITAL_COMMUNITY)
Admission: EM | Admit: 2018-04-24 | Discharge: 2018-04-24 | Disposition: A | Discharge status: Home / Self Care | Payer: Self-pay | Attending: Emergency Medicine | Admitting: Emergency Medicine

## 2018-04-24 DIAGNOSIS — I10 Essential (primary) hypertension: Secondary | ICD-10-CM | POA: Insufficient documentation

## 2018-04-24 DIAGNOSIS — M479 Spondylosis, unspecified: Secondary | ICD-10-CM | POA: Insufficient documentation

## 2018-04-24 DIAGNOSIS — Y999 Unspecified external cause status: Secondary | ICD-10-CM | POA: Insufficient documentation

## 2018-04-24 DIAGNOSIS — X58XXXA Exposure to other specified factors, initial encounter: Secondary | ICD-10-CM | POA: Insufficient documentation

## 2018-04-24 DIAGNOSIS — Y939 Activity, unspecified: Secondary | ICD-10-CM | POA: Insufficient documentation

## 2018-04-24 DIAGNOSIS — Y929 Unspecified place or not applicable: Secondary | ICD-10-CM | POA: Insufficient documentation

## 2018-04-24 DIAGNOSIS — Z7982 Long term (current) use of aspirin: Secondary | ICD-10-CM | POA: Insufficient documentation

## 2018-04-24 DIAGNOSIS — S39012A Strain of muscle, fascia and tendon of lower back, initial encounter: Secondary | ICD-10-CM | POA: Insufficient documentation

## 2018-04-24 MED ORDER — TRAMADOL HCL 50 MG PO TABS: ORAL_TABLET | ORAL | 0 refills | Status: DC

## 2018-04-24 NOTE — ED Triage Notes (Signed)
Patient reports back pain, has been seen at [facility] x 2 over the past few days. Patient reports pain on her R side around her scapula. [facility] did CT scan yesterday, results were negative except for arthritis. Patient currently on Flexeril, Toradol, Prednisone, and diclofenac.

## 2018-04-24 NOTE — ED Provider Notes (Signed)
[facility]  Tobacco Use  . Smoking status: Never Smoker  . Smokeless tobacco: Never Used  Substance Use Topics  . Alcohol use: Not Currently    Comment: occasional once on the weekends  . Drug use: No     Allergies   Codeine   Review of Systems Review of Systems  Constitutional: Negative for activity change.       All ROS Neg except as noted in HPI  HENT: Negative for nosebleeds.   Eyes: Negative for photophobia and discharge.  Respiratory: Negative for cough, shortness of breath and wheezing.   Cardiovascular: Negative for chest pain and palpitations.  Gastrointestinal: Negative for abdominal pain and blood in stool.  Genitourinary: Negative for dysuria, frequency and hematuria.  Musculoskeletal: Positive for back pain. Negative for arthralgias and neck pain.  Skin: Negative.   Neurological: Negative for dizziness, seizures and speech difficulty.  Psychiatric/Behavioral: Negative for confusion and  hallucinations.     Physical Exam Updated Vital Signs BP 97/80 (BP Location: Right Arm)   Pulse 89   Temp 98.1 F (36.7 C) (Oral)   Resp 18   Ht  (1.778 m)   Wt 103.4 kg (228 lb)   LMP 04/10/2018   SpO2 100%   BMI 32.71 kg/m   Physical Exam  Constitutional: She appears well-developed and well-nourished. No distress.  HENT:  Head: Normocephalic and atraumatic.  Right Ear: External ear normal.  Left Ear: External ear normal.  Eyes: Conjunctivae are normal. Right eye exhibits no discharge. Left eye exhibits no discharge. No scleral icterus.  Neck: Neck supple. No tracheal deviation present.  Cardiovascular: Normal rate, regular rhythm and intact distal pulses.  Pulmonary/Chest: Effort normal and breath sounds normal. No stridor. No respiratory distress. She has no wheezes. She has no rales.  Abdominal: Soft. Bowel sounds are normal. She exhibits no distension. There is no tenderness. There is no rebound and no guarding.  Musculoskeletal: She exhibits no edema or tenderness.       Thoracic back: She exhibits decreased range of motion, pain and spasm.       Back:  Neurological: She is alert. She has normal strength. No cranial nerve deficit (no facial droop, extraocular movements intact, no slurred speech) or sensory deficit. She exhibits normal muscle tone. She displays no seizure activity. Coordination normal.  Skin: Skin is warm and dry. No rash noted.  Psychiatric: She has a normal mood and affect.  Nursing note and vitals reviewed.    ED Treatments / Results  Labs (all labs ordered are listed, but only abnormal results are displayed) Labs Reviewed - No data to display  EKG None  Radiology No results found.  Procedures Procedures (including critical care time)  Medications Ordered in ED Medications - No data to display   Initial Impression / Assessment and Plan / ED Course  I have reviewed the triage vital signs and the nursing notes.  Pertinent labs  & imaging results that were available during my care of the patient were reviewed by me and considered in my medical decision making (see chart for details).       Final Clinical Impressions(s) / ED Diagnoses MDM  Patient states she has a work-related injury that involves her mid back.  She has been evaluated by the emergency department at [facility], and informed that she has arthritis as well as some degenerative changes.  She was told that she would need to see an orthopedic specialist, but she does not have insurance, and cannot see the specialist  at this time.  Examination at this time is negative for acute neurologic changes.  I can demonstrate the pain with attempted range of motion involving the lower thoracic area and upper lumbar area.  There is one area with some small region of palpable spasm.  Patient also states that the prednisone seems to bother her and not allow her to rest.  I have asked her to hold on the prednisone for now, to continue her anti-inflammatory medication and her muscle relaxer. Rx for ten tabs of Ultram given to the patient.  I have asked her to rest her back as much as possible and to use a heating pad.  I strongly encouraged her to see her orthopedic specialist as soon as possible.   Final diagnoses:  Strain of lumbar region, initial encounter  Osteoarthritis of lower back    ED Discharge Orders        Ordered    traMADol (ULTRAM) 50 MG tablet     04/24/18 1658       Ivery Quale, PA-C 04/24/18 1659    Margarita Grizzle, MD 04/24/18 2001

## 2018-04-24 NOTE — Discharge Instructions (Signed)
Your blood pressure is slightly below the normal, but otherwise her vital signs are within normal limits.  Your examination suggests spasm in the right back area.  Your previous work-up revealed arthritis and degenerative changes present.  It is important that you see the orthopedic specialist as soon as possible concerning the symptoms and problems.  Please continue your Flexeril and your anti-inflammatory medication.  Since you are having side effects from the prednisone, hold off on that medication for now.  Please use heating pad to your back, and rest her back as much as possible.  Use Ultram every 6 hours as needed for more severe pain.This medication may cause drowsiness. Please do not drink, drive, or participate in activity that requires concentration while taking this medication.

## 2018-07-04 ENCOUNTER — Other Ambulatory Visit: Payer: Self-pay

## 2018-07-04 ENCOUNTER — Emergency Department (HOSPITAL_COMMUNITY)
Admission: EM | Admit: 2018-07-04 | Discharge: 2018-07-04 | Disposition: A | Discharge status: Home / Self Care | Payer: BLUE CROSS/BLUE SHIELD | Attending: Emergency Medicine | Admitting: Emergency Medicine

## 2018-07-04 ENCOUNTER — Encounter (HOSPITAL_COMMUNITY): Payer: Self-pay | Admitting: Emergency Medicine

## 2018-07-04 DIAGNOSIS — Y9289 Other specified places as the place of occurrence of the external cause: Secondary | ICD-10-CM | POA: Diagnosis not present

## 2018-07-04 DIAGNOSIS — Y33XXXA Other specified events, undetermined intent, initial encounter: Secondary | ICD-10-CM | POA: Insufficient documentation

## 2018-07-04 DIAGNOSIS — S29019A Strain of muscle and tendon of unspecified wall of thorax, initial encounter: Secondary | ICD-10-CM

## 2018-07-04 DIAGNOSIS — S299XXA Unspecified injury of thorax, initial encounter: Secondary | ICD-10-CM | POA: Diagnosis present

## 2018-07-04 DIAGNOSIS — Y9389 Activity, other specified: Secondary | ICD-10-CM | POA: Insufficient documentation

## 2018-07-04 DIAGNOSIS — S29012A Strain of muscle and tendon of back wall of thorax, initial encounter: Secondary | ICD-10-CM | POA: Diagnosis not present

## 2018-07-04 DIAGNOSIS — Y990 Civilian activity done for income or pay: Secondary | ICD-10-CM | POA: Insufficient documentation

## 2018-07-04 DIAGNOSIS — I10 Essential (primary) hypertension: Secondary | ICD-10-CM | POA: Insufficient documentation

## 2018-07-04 MED ORDER — DICLOFENAC SODIUM 50 MG PO TBEC: 50.0000 mg | DELAYED_RELEASE_TABLET | Freq: Two times a day (BID) | ORAL | 0 refills | Status: AC

## 2018-07-04 MED ORDER — CYCLOBENZAPRINE HCL 5 MG PO TABS: 5.0000 mg | ORAL_TABLET | Freq: Three times a day (TID) | ORAL | 0 refills | Status: AC | PRN

## 2018-07-04 NOTE — ED Triage Notes (Signed)
Pt has chronic pain in lower back, injured back at work this morning when she tried to lift a box and almost dropped it.

## 2018-07-04 NOTE — ED Provider Notes (Signed)
[facility] EMERGENCY DEPARTMENT Provider Note   CSN: [id number] Arrival date & time: 07/04/18  0932     History   Chief Complaint Chief Complaint  Patient presents with  . Back Pain    HPI Marie Oliver is a 47 y.o. female with hx of chronic back pain who presents to the ED with back pain. Patient reports she injured her back at work this morning when she tried to lift a box that was heavy and almost dropped it. The pain is on the right side at the thoracic area. Patient reports working long hours and is tired.   HPI  Past Medical History:  Diagnosis Date  . Chronic back pain   . Hypertension     Patient Active Problem List   Diagnosis Date Noted  . Displaced bimalleolar fracture of right ankle 08/20/2015    Past Surgical History:  Procedure Laterality Date  . ORIF ANKLE FRACTURE Bilateral 08/20/2015   Procedure: OPEN REDUCTION INTERNAL FIXATION (ORIF) ANKLE FRACTURE MALLEOLAR;  Surgeon: Jodi Geralds, MD;  Location: [facility] OR;  Service: Orthopedics;  Laterality: Bilateral;  . TUBAL LIGATION       OB History    Gravida      Para      Term      Preterm      AB      Living  2     SAB      TAB      Ectopic      Multiple      Live Births               Home Medications    Prior to Admission medications   Medication Sig Start Date End Date Taking? Authorizing Provider  aspirin 325 MG tablet Take 2,600 mg by mouth daily as needed for moderate pain.    [provider]  cyclobenzaprine (FLEXERIL) 5 MG tablet Take 1 tablet (5 mg total) by mouth 3 (three) times daily as needed for muscle spasms. 07/04/18   Janne Napoleon, NP  diclofenac (VOLTAREN) 50 MG EC tablet Take 1 tablet (50 mg total) by mouth 2 (two) times daily. 07/04/18   Janne Napoleon, NP    Family History Family History  Problem Relation Age of Onset  . Hypertension Mother   . Diabetes Mother     Social History Social History   Tobacco Use  . Smoking status: Never Smoker  .  Smokeless tobacco: Never Used  Substance Use Topics  . Alcohol use: Not Currently    Comment: occasional once on the weekends  . Drug use: No     Allergies   Codeine   Review of Systems Review of Systems  Musculoskeletal: Positive for arthralgias and back pain.  All other systems reviewed and are negative.    Physical Exam Updated Vital Signs BP (!) 176/78 (BP Location: Right Arm)   Pulse 83   Temp 97.9 F (36.6 C) (Oral)   Resp 18   Ht 5\' 10"  (1.778 m)   Wt 108 kg (238 lb)   LMP 05/16/2018   SpO2 100%   BMI 34.15 kg/m   Physical Exam  Constitutional: She is oriented to person, place, and time. She appears well-developed and well-nourished. No distress.  HENT:  Head: Normocephalic.  Eyes: EOM are normal.  Neck: Normal range of motion. Neck supple. No spinous process tenderness present.  Cardiovascular: Normal rate and regular rhythm.  Pulmonary/Chest: Effort normal and breath sounds normal.  Abdominal: Soft. Bowel sounds are normal. There is no tenderness.  Musculoskeletal: Normal range of motion.       Thoracic back: She exhibits tenderness and spasm. She exhibits normal range of motion, no deformity and normal pulse.  Tender with palpation to the right thoracic area.   Neurological: She is alert and oriented to person, place, and time. She has normal strength. No cranial nerve deficit or sensory deficit. Gait normal.  Reflex Scores:      Bicep reflexes are 2+ on the right side and 2+ on the left side.      Brachioradialis reflexes are 2+ on the right side and 2+ on the left side.      Patellar reflexes are 2+ on the right side and 2+ on the left side. Straight leg raises without difficulty.  Skin: Skin is warm and dry.  Psychiatric: She has a normal mood and affect. Her behavior is normal.  Nursing note and vitals reviewed.    ED Treatments / Results  Labs (all labs ordered are listed, but only abnormal results are displayed) Labs Reviewed - No data to  display  Radiology No results found.  Procedures Procedures (including critical care time)  Medications Ordered in ED Medications - No data to display   Initial Impression / Assessment and Plan / ED Course  I have reviewed the triage vital signs and the nursing notes. Patient with back pain.  No neurological deficits and normal neuro exam.  Patient can walk but states is painful.  No loss of bowel or bladder control.  No concern for cauda equina.  No fever, night sweats, weight loss, h/o cancer, IVDU.  RICE protocol and pain medicine indicated and discussed with patient.   Final Clinical Impressions(s) / ED Diagnoses   Final diagnoses:  Thoracic myofascial strain, initial encounter    ED Discharge Orders        Ordered    diclofenac (VOLTAREN) 50 MG EC tablet  2 times daily     07/04/18 1002    cyclobenzaprine (FLEXERIL) 5 MG tablet  3 times daily PRN     07/04/18 [address]       Neese, AnnabellaHope M, [place]NP 07/04/18 1006    Linwood DibblesKnapp, Jon, MD 07/04/18 1547

## 2018-07-04 NOTE — Discharge Instructions (Addendum)
The muscle relaxer can make you sleepy. Follow up with your doctor. Return here as needed.

## 2019-03-09 ENCOUNTER — Encounter
